# Patient Record
Sex: Female | Born: 1990 | Race: White | Hispanic: No | Marital: Single | Smoking: Former smoker
Health system: Southern US, Community
[De-identification: ages and names within clinical notes are randomized; demographics above are authoritative.]

## PROBLEM LIST (undated history)

## (undated) ENCOUNTER — Inpatient Hospital Stay (HOSPITAL_COMMUNITY): Payer: Self-pay

## (undated) DIAGNOSIS — Z789 Other specified health status: Secondary | ICD-10-CM

## (undated) DIAGNOSIS — N390 Urinary tract infection, site not specified: Secondary | ICD-10-CM

## (undated) DIAGNOSIS — Z3490 Encounter for supervision of normal pregnancy, unspecified, unspecified trimester: Secondary | ICD-10-CM

## (undated) HISTORY — PX: NO PAST SURGERIES: SHX2092

## (undated) HISTORY — DX: Encounter for supervision of normal pregnancy, unspecified, unspecified trimester: Z34.90

---
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
## 2010-02-25 ENCOUNTER — Emergency Department (HOSPITAL_COMMUNITY): Admission: EM | Admit: 2010-02-25 | Discharge: 2010-02-25 | Payer: Self-pay | Admitting: Emergency Medicine

## 2010-09-26 ENCOUNTER — Emergency Department (HOSPITAL_COMMUNITY)
Admission: EM | Admit: 2010-09-26 | Discharge: 2010-09-26 | Payer: Self-pay | Source: Home / Self Care | Admitting: Emergency Medicine

## 2011-12-17 ENCOUNTER — Emergency Department (HOSPITAL_COMMUNITY)
Admission: EM | Admit: 2011-12-17 | Discharge: 2011-12-17 | Disposition: A | Discharge status: Home / Self Care | Payer: Self-pay | Attending: Emergency Medicine | Admitting: Emergency Medicine

## 2011-12-17 ENCOUNTER — Encounter (HOSPITAL_COMMUNITY): Payer: Self-pay | Admitting: *Deleted

## 2011-12-17 DIAGNOSIS — M545 Low back pain, unspecified: Secondary | ICD-10-CM | POA: Insufficient documentation

## 2011-12-17 DIAGNOSIS — Z7982 Long term (current) use of aspirin: Secondary | ICD-10-CM | POA: Insufficient documentation

## 2011-12-17 DIAGNOSIS — Z880 Allergy status to penicillin: Secondary | ICD-10-CM | POA: Insufficient documentation

## 2011-12-17 DIAGNOSIS — F17200 Nicotine dependence, unspecified, uncomplicated: Secondary | ICD-10-CM | POA: Insufficient documentation

## 2011-12-17 LAB — URINALYSIS, ROUTINE W REFLEX MICROSCOPIC
Bilirubin Urine: NEGATIVE
Glucose, UA: NEGATIVE mg/dL
Nitrite: NEGATIVE
Specific Gravity, Urine: 1.006 (ref 1.005–1.030)
pH: 6 (ref 5.0–8.0)

## 2011-12-17 MED ORDER — IBUPROFEN 600 MG PO TABS: 600.0000 mg | ORAL_TABLET | Freq: Four times a day (QID) | ORAL | Status: AC | PRN

## 2011-12-17 MED ORDER — CYCLOBENZAPRINE HCL 10 MG PO TABS: 10.0000 mg | ORAL_TABLET | Freq: Three times a day (TID) | ORAL | Status: AC | PRN

## 2011-12-17 NOTE — ED Provider Notes (Signed)
Medical screening examination/treatment/procedure(s) were performed by non-physician practitioner and as supervising physician I was immediately available for consultation/collaboration.   Andrew King, MD 12/17/11 2056 

## 2011-12-17 NOTE — ED Notes (Deleted)
Called patient for treatment room twice and no response. Nurse informed that patient went to the cafeteria.

## 2011-12-17 NOTE — ED Notes (Signed)
Called patient for room twice. No response.

## 2011-12-17 NOTE — Discharge Instructions (Signed)
Back Exercises   Back exercises help treat and prevent back injuries. The goal of back exercises is to increase the strength of your abdominal and back muscles and the flexibility of your back. These exercises should be started when you no longer have back pain. Back exercises include:   Pelvic Tilt. Lie on your back with your knees bent. Tilt your pelvis until the lower part of your back is against the floor. Hold this position 5 to 10 sec and repeat 5 to 10 times.   Knee to Chest. Pull first 1 knee up against your chest and hold for 20 to 30 seconds, repeat this with the other knee, and then both knees. This may be done with the other leg straight or bent, whichever feels better.   Sit-Ups or Curl-Ups. Bend your knees 90 degrees. Start with tilting your pelvis, and do a partial, slow sit-up, lifting your trunk only 30 to 45 degrees off the floor. Take at least 2 to 3 seconds for each sit-up. Do not do sit-ups with your knees out straight. If partial sit-ups are difficult, simply do the above but with only tightening your abdominal muscles and holding it as directed.   Hip-Lift. Lie on your back with your knees flexed 90 degrees. Push down with your feet and shoulders as you raise your hips a couple inches off the floor; hold for 10 seconds, repeat 5 to 10 times.   Back arches. Lie on your stomach, propping yourself up on bent elbows. Slowly press on your hands, causing an arch in your low back. Repeat 3 to 5 times. Any initial stiffness and discomfort should lessen with repetition over time.   Shoulder-Lifts. Lie face down with arms beside your body. Keep hips and torso pressed to floor as you slowly lift your head and shoulders off the floor.   Do not overdo your exercises, especially in the beginning. Exercises may cause you some mild back discomfort which lasts for a few minutes; however, if the pain is more severe, or lasts for more than 15 minutes, do not continue exercises until you see your caregiver.  Improvement with exercise therapy for back problems is slow.   See your caregivers for assistance with developing a proper back exercise program.   Document Released: 10/29/2004 Document Revised: 09/10/2011 Document Reviewed: 09/21/2005   [employer]® Patient Information ©2012 [employer].     Back Pain, Adult   Low back pain is very common. About 1 in 5 people have back pain. The cause of low back pain is rarely dangerous. The pain often gets better over time. About half of people with a sudden onset of back pain feel better in just 2 weeks. About 8 in 10 people feel better by 6 weeks.   CAUSES   Some common causes of back pain include:   Strain of the muscles or ligaments supporting the spine.   Wear and tear (degeneration) of the spinal discs.   Arthritis.   Direct injury to the back.   DIAGNOSIS   Most of the time, the direct cause of low back pain is not known. However, back pain can be treated effectively even when the exact cause of the pain is unknown. Answering your caregiver's questions about your overall health and symptoms is one of the most accurate ways to make sure the cause of your pain is not dangerous. If your caregiver needs more information, he or she may order lab work or imaging tests (X-rays or MRIs). However, even   if imaging tests show changes in your back, this usually does not require surgery.   HOME CARE INSTRUCTIONS   For many people, back pain returns. Since low back pain is rarely dangerous, it is often a condition that people can learn to manage on their own.   Remain active. It is stressful on the back to sit or stand in one place. Do not sit, drive, or stand in one place for more than 30 minutes at a time. Take short walks on level surfaces as soon as pain allows. Try to increase the length of time you walk each day.   Do not stay in bed. Resting more than 1 or 2 days can delay your recovery.   Do not avoid exercise or work. Your body is made to move. It is not dangerous to be active,  even though your back may hurt. Your back will likely heal faster if you return to being active before your pain is gone.   Pay attention to your body when you bend and lift. Many people have less discomfort when lifting if they bend their knees, keep the load close to their bodies, and avoid twisting. Often, the most comfortable positions are those that put less stress on your recovering back.   Find a comfortable position to sleep. Use a firm mattress and lie on your side with your knees slightly bent. If you lie on your back, put a pillow under your knees.   Only take over-the-counter or prescription medicines as directed by your caregiver. Over-the-counter medicines to reduce pain and inflammation are often the most helpful. Your caregiver may prescribe muscle relaxant drugs. These medicines help dull your pain so you can more quickly return to your normal activities and healthy exercise.   Put ice on the injured area.   Put ice in a plastic bag.   Place a towel between your skin and the bag.   Leave the ice on for 15 to 20 minutes, 3 to 4 times a day for the first 2 to 3 days. After that, ice and heat may be alternated to reduce pain and spasms.   Ask your caregiver about trying back exercises and gentle massage. This may be of some benefit.   Avoid feeling anxious or stressed. Stress increases muscle tension and can worsen back pain. It is important to recognize when you are anxious or stressed and learn ways to manage it. Exercise is a great option.   SEEK MEDICAL CARE IF:   You have pain that is not relieved with rest or medicine.   You have pain that does not improve in 1 week.   You have new symptoms.   You are generally not feeling well.   SEEK IMMEDIATE MEDICAL CARE IF:   You have pain that radiates from your back into your legs.   You develop new bowel or bladder control problems.   You have unusual weakness or numbness in your arms or legs.   You develop nausea or vomiting.   You develop abdominal  pain.   You feel faint.   Document Released: 09/21/2005 Document Revised: 09/10/2011 Document Reviewed: 02/09/2011   [employer]® Patient Information ©2012 [employer].

## 2011-12-17 NOTE — ED Notes (Signed)
Called to reassess.  Pt not in waiting room.  Other pts report pt went up in elevator.

## 2011-12-17 NOTE — ED Notes (Signed)
Pt reports 5 day hx of lower back pain radiating from mid back to bilateral flank. Reports increased urination. Denies fevers.

## 2011-12-17 NOTE — ED Notes (Signed)
Pt returned from upstairs.  No distress noted, pt ambulating without difficulty.

## 2011-12-17 NOTE — ED Provider Notes (Signed)
History     CSN: [id number]  Arrival date & time 12/17/11  1616   First MD Initiated Contact with Patient 12/17/11 2003      Chief Complaint  Patient presents with  . Back Pain    (Consider location/radiation/quality/duration/timing/severity/associated sxs/prior treatment) HPI Comments: 5 days ago to walk on the treadmill since then.  She has had lower back pain.  She has taken Advil without relief.  She has constant pain in her lower back  Patient is a 21 y.o. female presenting with back pain. The history is provided by the patient.  Back Pain  This is a new problem. The current episode started more than 2 days ago. The problem occurs constantly. The problem has not changed since onset.The pain is associated with twisting. The quality of the pain is described as aching. The pain does not radiate. The pain is at a severity of 5/10. The symptoms are aggravated by certain positions. Pertinent negatives include no fever. She has tried NSAIDs for the symptoms. Risk factors include obesity and lack of exercise.    History reviewed. No pertinent past medical history.  History reviewed. No pertinent past surgical history.  History reviewed. No pertinent family history.  History  Substance Use Topics  . Smoking status: Current Everyday Smoker  . Smokeless tobacco: Not on file  . Alcohol Use: Yes    OB History    Grav Para Term Preterm Abortions TAB SAB Ect Mult Living                  Review of Systems  Constitutional: Negative for fever and chills.  Musculoskeletal: Positive for back pain.  Neurological: Negative for dizziness.    Allergies  Penicillins  Home Medications   Current Outpatient Rx  Name Route Sig Dispense Refill  . ASPIRIN 325 MG PO TABS Oral Take 325 mg by mouth daily as needed. For pain    . BISMUTH SUBSALICYLATE 262 MG/15ML PO SUSP Oral Take 15 mLs by mouth every 6 (six) hours as needed. For stomach pain    . NAPROXEN SODIUM 220 MG PO TABS Oral Take  660 mg by mouth daily as needed. For pain    . CYCLOBENZAPRINE HCL 10 MG PO TABS Oral Take 1 tablet (10 mg total) by mouth 3 (three) times daily as needed for muscle spasms. 30 tablet 0  . IBUPROFEN 600 MG PO TABS Oral Take 1 tablet (600 mg total) by mouth every 6 (six) hours as needed for pain. 30 tablet 0    BP 128/78  Pulse 79  Temp(Src) 97.8 F (36.6 C) (Oral)  Resp 18  SpO2 100%  LMP 12/10/2011  Physical Exam  Constitutional: She is oriented to person, place, and time. She appears well-developed.  HENT:  Head: Normocephalic.  Neck: Normal range of motion.  Cardiovascular: Normal rate.   Pulmonary/Chest: Effort normal.  Musculoskeletal: Normal range of motion.  Neurological: She is alert and oriented to person, place, and time.  Skin: Skin is warm.    ED Course  Procedures (including critical care time)   Labs Reviewed  URINALYSIS, ROUTINE W REFLEX MICROSCOPIC  POCT PREGNANCY, URINE   No results found.   1. Low back pain       MDM  Low back pain due to exercise regime.  Will prescribe Flexeril and regular.  Uses of nonsteroidal        Arman Filter, NP 12/17/11 2017  Arman Filter, NP 12/17/11 2019

## 2012-08-08 ENCOUNTER — Emergency Department (INDEPENDENT_AMBULATORY_CARE_PROVIDER_SITE_OTHER)
Admission: EM | Admit: 2012-08-08 | Discharge: 2012-08-08 | Disposition: A | Discharge status: Home / Self Care | Payer: BC Managed Care – PPO | Source: Home / Self Care | Attending: Emergency Medicine | Admitting: Emergency Medicine

## 2012-08-08 ENCOUNTER — Encounter (HOSPITAL_COMMUNITY): Payer: Self-pay | Admitting: Emergency Medicine

## 2012-08-08 DIAGNOSIS — R52 Pain, unspecified: Secondary | ICD-10-CM

## 2012-08-08 DIAGNOSIS — L03319 Cellulitis of trunk, unspecified: Secondary | ICD-10-CM

## 2012-08-08 DIAGNOSIS — L02212 Cutaneous abscess of back [any part, except buttock]: Secondary | ICD-10-CM

## 2012-08-08 MED ORDER — HYDROCODONE-ACETAMINOPHEN 5-325 MG PO TABS
ORAL_TABLET | ORAL | Status: AC
  Filled 2012-08-08: qty 1

## 2012-08-08 MED ORDER — SULFAMETHOXAZOLE-TRIMETHOPRIM 800-160 MG PO TABS: 1.0000 | ORAL_TABLET | Freq: Two times a day (BID) | ORAL | Status: AC

## 2012-08-08 MED ORDER — HYDROCODONE-ACETAMINOPHEN 5-325 MG PO TABS: 1.0000 | ORAL_TABLET | Freq: Once | ORAL | Status: DC

## 2012-08-08 MED ORDER — HYDROCODONE-ACETAMINOPHEN 5-325 MG PO TABS
1.0000 | ORAL_TABLET | Freq: Once | ORAL | Status: AC
  Administered 2012-08-08: 1 via ORAL

## 2012-08-08 NOTE — ED Notes (Signed)
Large, red area in mid upper back, warm to touch.  Area is tender to touch.  Patient in a gown.

## 2012-08-08 NOTE — ED Provider Notes (Signed)
History     CSN: [id number]  Arrival date & time 08/08/12  1816   First MD Initiated Contact with Patient 08/08/12 1953      Chief Complaint  Patient presents with  . Abscess    (Consider location/radiation/quality/duration/timing/severity/associated sxs/prior treatment) Patient is a 21 y.o. female presenting with abscess. The history is provided by the patient and the spouse.  Abscess  This is a new problem. The current episode started less than one week ago. The onset was gradual. The problem has been gradually worsening. The abscess is present on the back. The problem is moderate. The abscess is characterized by redness, painfulness and swelling. It is unknown what she was exposed to. The abscess first occurred at home. Pertinent negatives include no anorexia, no decrease in physical activity, not sleeping less, no fever, no diarrhea and no vomiting. Her past medical history does not include atopy in family or skin abscesses in family. There were no sick contacts. Recent Medical Care: husband tried to drain this week with no success.  no history of abscess  History reviewed. No pertinent past medical history.  History reviewed. No pertinent past surgical history.  No family history on file.  History  Substance Use Topics  . Smoking status: Current Every Day Smoker  . Smokeless tobacco: Not on file  . Alcohol Use: Yes    OB History    Grav Para Term Preterm Abortions TAB SAB Ect Mult Living                  Review of Systems  Constitutional: Negative for fever.  Gastrointestinal: Negative for vomiting, diarrhea and anorexia.  Skin: Positive for wound.  All other systems reviewed and are negative.    Allergies  Penicillins  Home Medications   Current Outpatient Rx  Name  Route  Sig  Dispense  Refill  . MIDOL PO   Oral   Take by mouth.         . ASPIRIN 325 MG PO TABS   Oral   Take 325 mg by mouth daily as needed. For pain         . BISMUTH  SUBSALICYLATE 262 MG/15ML PO SUSP   Oral   Take 15 mLs by mouth every 6 (six) hours as needed. For stomach pain         . HYDROCODONE-ACETAMINOPHEN 5-325 MG PO TABS   Oral   Take 1 tablet by mouth once.   30 tablet   0   . NAPROXEN SODIUM 220 MG PO TABS   Oral   Take 660 mg by mouth daily as needed. For pain         . SULFAMETHOXAZOLE-TRIMETHOPRIM 800-160 MG PO TABS   Oral   Take 1 tablet by mouth 2 (two) times daily.   14 tablet   0     BP 129/87  Pulse 83  Temp 98.3 F (36.8 C) (Oral)  Resp 18  SpO2 99%  LMP 08/08/2012  Physical Exam  Nursing note and vitals reviewed. Constitutional: She is oriented to person, place, and time. Vital signs are normal. She appears well-developed and well-nourished. She is active and cooperative.  HENT:  Head: Normocephalic.  Eyes: Conjunctivae normal are normal. Pupils are equal, round, and reactive to light. No scleral icterus.  Neck: Trachea normal. Neck supple.  Cardiovascular: Normal rate, regular rhythm, normal heart sounds and intact distal pulses.   Pulmonary/Chest: Effort normal and breath sounds normal.  Neurological: She is alert and oriented  to person, place, and time. No cranial nerve deficit or sensory deficit.  Skin: Skin is warm and dry.     Psychiatric: She has a normal mood and affect. Her speech is normal and behavior is normal. Judgment and thought content normal. Cognition and memory are normal.    ED Course  INCISION AND DRAINAGE Date/Time: 08/08/2012 8:35 PM Performed by: Nigel Mormon Angelene Rome L Authorized by: Jimmie Molly Consent: Verbal consent obtained. Risks and benefits: risks, benefits and alternatives were discussed Consent given by: patient and parent Patient understanding: patient states understanding of the procedure being performed Patient consent: the patient's understanding of the procedure matches consent given Procedure consent: procedure consent matches procedure scheduled Patient identity  confirmed: verbally with patient and arm band Time out: Immediately prior to procedure a "time out" was called to verify the correct patient, procedure, equipment, support staff and site/side marked as required. Type: abscess Body area: trunk Location details: back Anesthesia: local infiltration Local anesthetic: lidocaine 2% with epinephrine Anesthetic total: 2 ml Scalpel size: 11 Needle gauge: 27. Incision type: cross cut. Complexity: simple Drainage: purulent, serosanguinous and serous Drainage amount: moderate Wound treatment: wound left open Packing material: 1/4 in iodoform gauze Patient tolerance: Patient tolerated the procedure well with no immediate complications.   (including critical care time)   Labs Reviewed  CULTURE, ROUTINE-ABSCESS   No results found.   1. Abscess of upper back excluding scapular region   2. Pain       MDM  Remove iodoform in 36-48 hour, rtc in 3 days for wound recheck.  Begin antibiotics as prescribed, await culture results.          Johnsie Kindred, NP 08/08/12 2057

## 2012-08-08 NOTE — ED Notes (Signed)
Patient reports abscess in between shoulders.  Reports a fullness for 2 years and had been told to just watch "cyst" .  Told if it bothers her or were to get infected, call a dermatologist.  Friend tried to open cyst , now infected.  Cyst was cut open a month ago.  Reports she hit this area on something at work, now very painful

## 2012-08-09 NOTE — ED Provider Notes (Signed)
Medical screening examination/treatment/procedure(s) were performed by non-physician practitioner and as supervising physician I was immediately available for consultation/collaboration.  Raynald Blend, MD 08/09/12 1054

## 2012-08-12 LAB — CULTURE, ROUTINE-ABSCESS

## 2012-08-18 NOTE — ED Notes (Signed)
Abscess culture upper middle back: mod. diptheroids ( corynebacterium species).  Lab shown to Dr. Lorenz Coaster and he said it is skin contaminant.  No further action needed. Chelsea Dorsey 08/18/2012

## 2013-04-10 ENCOUNTER — Encounter (HOSPITAL_COMMUNITY): Payer: Self-pay | Admitting: *Deleted

## 2013-04-10 ENCOUNTER — Emergency Department (HOSPITAL_COMMUNITY)
Admission: EM | Admit: 2013-04-10 | Discharge: 2013-04-10 | Disposition: A | Discharge status: Home / Self Care | Payer: Self-pay | Attending: Emergency Medicine | Admitting: Emergency Medicine

## 2013-04-10 DIAGNOSIS — Z79899 Other long term (current) drug therapy: Secondary | ICD-10-CM | POA: Insufficient documentation

## 2013-04-10 DIAGNOSIS — R109 Unspecified abdominal pain: Secondary | ICD-10-CM | POA: Insufficient documentation

## 2013-04-10 DIAGNOSIS — Z880 Allergy status to penicillin: Secondary | ICD-10-CM | POA: Insufficient documentation

## 2013-04-10 DIAGNOSIS — R112 Nausea with vomiting, unspecified: Secondary | ICD-10-CM | POA: Insufficient documentation

## 2013-04-10 DIAGNOSIS — Z3202 Encounter for pregnancy test, result negative: Secondary | ICD-10-CM | POA: Insufficient documentation

## 2013-04-10 DIAGNOSIS — F17200 Nicotine dependence, unspecified, uncomplicated: Secondary | ICD-10-CM | POA: Insufficient documentation

## 2013-04-10 LAB — URINALYSIS, ROUTINE W REFLEX MICROSCOPIC
Bilirubin Urine: NEGATIVE
Glucose, UA: NEGATIVE mg/dL
Ketones, ur: NEGATIVE mg/dL
Nitrite: NEGATIVE
pH: 6 (ref 5.0–8.0)

## 2013-04-10 MED ORDER — ONDANSETRON 4 MG PO TBDP
8.0000 mg | ORAL_TABLET | Freq: Once | ORAL | Status: AC
  Administered 2013-04-10: 8 mg via ORAL
  Filled 2013-04-10: qty 2

## 2013-04-10 MED ORDER — ONDANSETRON HCL 4 MG PO TABS: 4.0000 mg | ORAL_TABLET | Freq: Four times a day (QID) | ORAL | Status: DC

## 2013-04-10 NOTE — ED Provider Notes (Signed)
History    CSN: [id number] Arrival date & time 04/10/13  7829  First MD Initiated Contact with Patient 04/10/13 1011     Chief Complaint  Patient presents with  . Emesis   (Consider location/radiation/quality/duration/timing/severity/associated sxs/prior Treatment) Patient is a 22 y.o. female presenting with vomiting. The history is provided by the patient. No language interpreter was used.  Emesis Severity:  Mild Associated symptoms: abdominal pain   Associated symptoms: no chills and no diarrhea   Associated symptoms comment:  Morning nausea and vomiting x 1 yesterday and x 2 today. Mild right sided abdominal discomfort that is mild. She reports she is able to drink Sprite regularly without vomiting. No fever, diarrhea or hematemesis. She reports trying to become pregnant but getting negative tests at home.   History reviewed. No pertinent past medical history. History reviewed. No pertinent past surgical history. No family history on file. History  Substance Use Topics  . Smoking status: Current Every Day Smoker  . Smokeless tobacco: Not on file  . Alcohol Use: Yes   OB History   Grav Para Term Preterm Abortions TAB SAB Ect Mult Living                 Review of Systems  Constitutional: Negative for fever and chills.  Respiratory: Negative.  Negative for cough and shortness of breath.   Cardiovascular: Negative.  Negative for chest pain.  Gastrointestinal: Positive for nausea, vomiting and abdominal pain. Negative for diarrhea.    Allergies  Penicillins  Home Medications   Current Outpatient Rx  Name  Route  Sig  Dispense  Refill  . aspirin-acetaminophen-caffeine (EXCEDRIN MIGRAINE) 250-250-65 MG per tablet   Oral   Take 2 tablets by mouth every 6 (six) hours as needed for pain.         Marland Kitchen bismuth subsalicylate (PEPTO BISMOL) 262 MG/15ML suspension   Oral   Take 15 mLs by mouth every 6 (six) hours as needed. For stomach pain          BP 138/86  Pulse 81   Temp(Src) 97.7 F (36.5 C) (Oral)  Resp 20  SpO2 99% Physical Exam  Constitutional: She is oriented to person, place, and time. She appears well-developed and well-nourished.  HENT:  Head: Normocephalic.  Mouth/Throat: Oropharynx is clear and moist.  Neck: Normal range of motion. Neck supple.  Cardiovascular: Normal rate and regular rhythm.   Pulmonary/Chest: Effort normal and breath sounds normal.  Abdominal: Soft. Bowel sounds are normal. There is tenderness. There is no rebound and no guarding.  Minimal right periumbilical tenderness. No guarding or rebound.   Musculoskeletal: Normal range of motion.  Neurological: She is alert and oriented to person, place, and time.  Skin: Skin is warm and dry. No rash noted.  Psychiatric: She has a normal mood and affect.    ED Course  Procedures (including critical care time) Labs Reviewed  URINALYSIS, ROUTINE W REFLEX MICROSCOPIC  POCT PREGNANCY, URINE   Results for orders placed during the hospital encounter of 04/10/13  URINALYSIS, ROUTINE W REFLEX MICROSCOPIC      Result Value Range   Color, Urine YELLOW  YELLOW   APPearance CLOUDY (*) CLEAR   Specific Gravity, Urine 1.019  1.005 - 1.030   pH 6.0  5.0 - 8.0   Glucose, UA NEGATIVE  NEGATIVE mg/dL   Hgb urine dipstick NEGATIVE  NEGATIVE   Bilirubin Urine NEGATIVE  NEGATIVE   Ketones, ur NEGATIVE  NEGATIVE mg/dL   Protein, ur  NEGATIVE  NEGATIVE mg/dL   Urobilinogen, UA 0.2  0.0 - 1.0 mg/dL   Nitrite NEGATIVE  NEGATIVE   Leukocytes, UA NEGATIVE  NEGATIVE  POCT PREGNANCY, URINE      Result Value Range   Preg Test, Ur NEGATIVE  NEGATIVE     No results found. No diagnosis found. 1. Nausea with vomiting MDM  Pregnancy tests are negative in ED. She is tolerating PO fluids and appears stable. Zofran Rx.   Arnoldo Hooker, PA-C 04/19/13 [phone]

## 2013-04-10 NOTE — ED Notes (Signed)
Pt is here with lower abdominal discomfort, states that she started having nausea and burps that smell like rotten eggs, today vomited once.  LMP:  Middle of June.  No vag discharge

## 2013-04-19 NOTE — ED Provider Notes (Signed)
Medical screening examination/treatment/procedure(s) were performed by non-physician practitioner and as supervising physician I was immediately available for consultation/collaboration.  Kanijah Groseclose T Janine Reller, MD 04/19/13 1531 

## 2013-08-28 ENCOUNTER — Encounter (HOSPITAL_COMMUNITY): Payer: Self-pay | Admitting: *Deleted

## 2013-08-28 ENCOUNTER — Inpatient Hospital Stay (HOSPITAL_COMMUNITY)
Admission: AD | Admit: 2013-08-28 | Discharge: 2013-08-28 | Disposition: A | Discharge status: Home / Self Care | Payer: Self-pay | Source: Ambulatory Visit | Attending: Obstetrics and Gynecology | Admitting: Obstetrics and Gynecology

## 2013-08-28 DIAGNOSIS — N926 Irregular menstruation, unspecified: Secondary | ICD-10-CM | POA: Insufficient documentation

## 2013-08-28 DIAGNOSIS — B9689 Other specified bacterial agents as the cause of diseases classified elsewhere: Secondary | ICD-10-CM | POA: Insufficient documentation

## 2013-08-28 DIAGNOSIS — A499 Bacterial infection, unspecified: Secondary | ICD-10-CM | POA: Insufficient documentation

## 2013-08-28 DIAGNOSIS — N760 Acute vaginitis: Secondary | ICD-10-CM | POA: Insufficient documentation

## 2013-08-28 DIAGNOSIS — M549 Dorsalgia, unspecified: Secondary | ICD-10-CM | POA: Insufficient documentation

## 2013-08-28 DIAGNOSIS — F17200 Nicotine dependence, unspecified, uncomplicated: Secondary | ICD-10-CM | POA: Insufficient documentation

## 2013-08-28 HISTORY — DX: Urinary tract infection, site not specified: N39.0

## 2013-08-28 LAB — URINALYSIS, ROUTINE W REFLEX MICROSCOPIC
Glucose, UA: NEGATIVE mg/dL
Hgb urine dipstick: NEGATIVE
Specific Gravity, Urine: 1.025 (ref 1.005–1.030)

## 2013-08-28 LAB — HCG, SERUM, QUALITATIVE: Preg, Serum: NEGATIVE

## 2013-08-28 LAB — POCT PREGNANCY, URINE: Preg Test, Ur: NEGATIVE

## 2013-08-28 LAB — WET PREP, GENITAL

## 2013-08-28 MED ORDER — PRENATAL PLUS 27-1 MG PO TABS: 1.0000 | ORAL_TABLET | Freq: Every day | ORAL | Status: DC

## 2013-08-28 MED ORDER — METRONIDAZOLE 500 MG PO TABS: 500.0000 mg | ORAL_TABLET | Freq: Two times a day (BID) | ORAL | Status: AC

## 2013-08-28 NOTE — MAU Provider Note (Signed)
CC: Possible Pregnancy and Back Pain     HPI Chelsea Dorsey is a 22 y.o.  Nulligravida who presents with missed menses and thinks she's pregnant despite neg HPT. Requests blood test. LMP 07/07/13 and spotted 08/09/13. Prior to October, she had had regular monthly cycles. Has premenstrual-like cramping that radiates to low back. No irritative vaginal discharge.  No contraception and attempting pregnancy.  Past Medical History  Diagnosis Date  . Asthma   . UTI (lower urinary tract infection)     OB History  Gravida Para Term Preterm AB SAB TAB Ectopic Multiple Living  0                 No past surgical history on file.  History   Social History  . Marital Status: Married    Spouse Name: N/A    Number of Children: N/A  . Years of Education: N/A   Occupational History  . Not on file.   Social History Main Topics  . Smoking status: Current Every Day Smoker  . Smokeless tobacco: Not on file  . Alcohol Use: Yes  . Drug Use: No  . Sexual Activity:    Other Topics Concern  . Not on file   Social History Narrative  . No narrative on file    No current facility-administered medications on file prior to encounter.   Current Outpatient Prescriptions on File Prior to Encounter  Medication Sig Dispense Refill  . aspirin-acetaminophen-caffeine (EXCEDRIN MIGRAINE) 250-250-65 MG per tablet Take 2 tablets by mouth every 6 (six) hours as needed for pain.        Allergies  Allergen Reactions  . Penicillins Hives    ROS Pertinent items in HPI  PHYSICAL EXAM Filed Vitals:   08/28/13 1959  BP: 125/93  Pulse: 71  Temp: 98.5 F (36.9 C)  Resp: 20   General: Obese female in no acute distress Cardiovascular: Normal rate Respiratory: Normal effort Abdomen: Soft, nontender Back: No CVAT Extremities: No edema Neurologic: Alert and oriented Speculum exam: NEFG; vagina with gray mucusy discharge, scant blood; cervix clean Bimanual exam: cervix closed, no CMT; uterus  NSSP; no adnexal tenderness or masses   LAB RESULTS Results for orders placed during the hospital encounter of 08/28/13 (from the past 24 hour(s))  URINALYSIS, ROUTINE W REFLEX MICROSCOPIC     Status: None   Collection Time    08/28/13  8:00 PM      Result Value Range   Color, Urine YELLOW  YELLOW   APPearance CLEAR  CLEAR   Specific Gravity, Urine 1.025  1.005 - 1.030   pH 6.5  5.0 - 8.0   Glucose, UA NEGATIVE  NEGATIVE mg/dL   Hgb urine dipstick NEGATIVE  NEGATIVE   Bilirubin Urine NEGATIVE  NEGATIVE   Ketones, ur NEGATIVE  NEGATIVE mg/dL   Protein, ur NEGATIVE  NEGATIVE mg/dL   Urobilinogen, UA 0.2  0.0 - 1.0 mg/dL   Nitrite NEGATIVE  NEGATIVE   Leukocytes, UA NEGATIVE  NEGATIVE  POCT PREGNANCY, URINE     Status: None   Collection Time    08/28/13  8:07 PM      Result Value Range   Preg Test, Ur NEGATIVE  NEGATIVE  WET PREP, GENITAL     Status: Abnormal   Collection Time    08/28/13  8:52 PM      Result Value Range   Yeast Wet Prep HPF POC NONE SEEN  NONE SEEN   Trich, Wet Prep NONE SEEN  NONE SEEN   Clue Cells Wet Prep HPF POC MODERATE (*) NONE SEEN   WBC, Wet Prep HPF POC RARE (*) NONE SEEN    IMAGING No results found.  MAU COURSE GC/CT sent  ASSESSMENT  1. Irregular menstrual cycle   2. BV (bacterial vaginosis)   3. Smoker   Probably beginning menstual period  PLAN Discharge home. See AVS for patient education. Keep bleeding calendar and stop smoking   Medication List    STOP taking these medications       aspirin-acetaminophen-caffeine 250-250-65 MG per tablet  Commonly known as:  EXCEDRIN MIGRAINE      TAKE these medications       calcium carbonate 500 MG chewable tablet  Commonly known as:  TUMS - dosed in mg elemental calcium  Chew 1-2 tablets by mouth daily as needed for indigestion or heartburn.     prenatal vitamin w/FE, FA 27-1 MG Tabs tablet  Take 1 tablet by mouth daily.       Follow-up Information   Follow up with [facility].   Specialty:  Obstetrics and Gynecology   Contact information:   [address] [place] [address] [phone]      Schedule an appointment as soon as possible for a visit with [facility]. (As needed)    Specialty:  Obstetrics and Gynecology   Contact information:   [address] [place] [address] [phone]       Danae Orleans, CNM 08/28/2013 8:29 PM

## 2013-08-28 NOTE — MAU Note (Signed)
LMP 07/07/2013, beginning of November started having lower back pain, nausea.

## 2013-08-29 LAB — GC/CHLAMYDIA PROBE AMP: GC Probe RNA: NEGATIVE

## 2013-08-30 NOTE — MAU Provider Note (Signed)
Attestation of Attending Supervision of Advanced Practitioner (CNM/NP): Evaluation and management procedures were performed by the Advanced Practitioner under my supervision and collaboration.  I have reviewed the Advanced Practitioner's note and chart, and I agree with the management and plan.  Elanna Bert 08/30/2013 9:39 AM   

## 2014-04-12 ENCOUNTER — Emergency Department (HOSPITAL_COMMUNITY)
Admission: EM | Admit: 2014-04-12 | Discharge: 2014-04-12 | Disposition: A | Discharge status: Home / Self Care | Payer: BC Managed Care – PPO | Source: Home / Self Care | Attending: Emergency Medicine | Admitting: Emergency Medicine

## 2014-04-12 ENCOUNTER — Encounter (HOSPITAL_COMMUNITY): Payer: Self-pay | Admitting: Emergency Medicine

## 2014-04-12 DIAGNOSIS — J039 Acute tonsillitis, unspecified: Secondary | ICD-10-CM

## 2014-04-12 LAB — POCT RAPID STREP A: Streptococcus, Group A Screen (Direct): NEGATIVE

## 2014-04-12 MED ORDER — PREDNISONE 20 MG PO TABS
20.0000 mg | ORAL_TABLET | Freq: Two times a day (BID) | ORAL | Status: DC
Start: 2014-04-12 — End: 2014-04-23

## 2014-04-12 MED ORDER — AZITHROMYCIN 500 MG PO TABS: 500.0000 mg | ORAL_TABLET | Freq: Every day | ORAL | Status: DC

## 2014-04-12 NOTE — ED Provider Notes (Signed)
  Chief Complaint   Chief Complaint  Patient presents with  . Sore Throat    History of Present Illness   Chelsea Dorsey is a 23 year old female who's had a three-day history of severe sore throat, pain on swallowing, white spots on her tonsils, subjective fever, chills, sweats, nasal congestion, rhinorrhea, headache, neck pain, left ear pain, and cough. She denies any exposure to strep or any other illnesses.   Review of Systems   Other than as noted above, the patient denies any of the following symptoms. Systemic:  No fever, chills, sweats, myalgias, or headache. Eye:  No redness, pain or drainage. ENT:  No earache, nasal congestion, sneezing, rhinorrhea, sinus pressure, sinus pain, or post nasal drip. Lungs:  No cough, sputum production, wheezing, shortness of breath, or chest pain. GI:  No abdominal pain, nausea, vomiting, or diarrhea. Skin:  No rash.  White   Past medical history, family history, social history, meds, and allergies were reviewed.   Physical Exam     Vital signs:  BP 121/78  Pulse 83  Temp(Src) 98.2 F (36.8 C) (Oral)  Resp 16  SpO2 100%  LMP 04/01/2014 General:  Alert, in no distress. Phonation was normal, no drooling, and patient was able to handle secretions well.  Eye:  No conjunctival injection or drainage. Lids were normal. ENT:  TMs and canals were normal, without erythema or inflammation.  Nasal mucosa was clear and uncongested, without drainage.  Mucous membranes were moist.  Exam of pharynx tonsils are mildly enlarged and covered with white exudate.  There were no oral ulcerations or lesions. There was no bulging of the tonsillar pillars, and the uvula was midline. Neck:  Supple, anterior cervical nodes are tender but not enlarged. Lungs:  No respiratory distress.  Lungs were clear to auscultation, without wheezes, rales or rhonchi.  Breath sounds were clear and equal bilaterally.  Heart:  Regular rhythm, without gallops, murmers or rubs. Skin:   Clear, warm, and dry, without rash or lesions.  Labs   Results for orders placed during the hospital encounter of 04/12/14  POCT RAPID STREP A ([facility]RG CARE ONLY)      Result Value Ref Range   Streptococcus, Group A Screen (Direct) NEGATIVE  NEGATIVE   Assessment   The encounter diagnosis was Tonsillitis.  There is no evidence of a peritonsillar abscess, retropharyngeal abscess, or epiglottitis.   Plan     1.  Meds:  The following meds were prescribed:   Discharge Medication List as of 04/12/2014 10:36 AM    START taking these medications   Details  azithromycin (ZITHROMAX) 500 MG tablet Take 1 tablet (500 mg total) by mouth daily., Starting 04/12/2014, Until Discontinued, Normal    predniSONE (DELTASONE) 20 MG tablet Take 1 tablet (20 mg total) by mouth 2 (two) times daily., Starting 04/12/2014, Until Discontinued, Normal        2.  Patient Education/Counseling:  The patient was given appropriate handouts, self care instructions, and instructed in symptomatic relief, including hot saline gargles, throat lozenges, infectious precautions, and need to trade out toothbrush.    3.  Follow up:  The patient was told to follow up here if no better in 3 to 4 days, or sooner if becoming worse in any way, and given some red flag symptoms such as difficulty swallowing or breathing which would prompt immediate return.      Harden Mo, MD 04/12/14 [phone]

## 2014-04-12 NOTE — Discharge Instructions (Signed)
Tonsillitis Tonsillitis is an infection of the throat that causes the tonsils to become red, tender, and swollen. Tonsils are collections of lymphoid tissue at the back of the throat. Each tonsil has crevices (crypts). Tonsils help fight nose and throat infections and keep infection from spreading to other parts of the body for the first 18 months of life.  CAUSES Sudden (acute) tonsillitis is usually caused by infection with streptococcal bacteria. Long-lasting (chronic) tonsillitis occurs when the crypts of the tonsils become filled with pieces of food and bacteria, which makes it easy for the tonsils to become repeatedly infected. SYMPTOMS  Symptoms of tonsillitis include:  A sore throat, with possible difficulty swallowing.  White patches on the tonsils.  Fever.  Tiredness.  New episodes of snoring during sleep, when you did not snore before.  Small, foul-smelling, yellowish-white pieces of material (tonsilloliths) that you occasionally cough up or spit out. The tonsilloliths can also cause you to have bad breath. DIAGNOSIS Tonsillitis can be diagnosed through a physical exam. Diagnosis can be confirmed with the results of lab tests, including a throat culture. TREATMENT  The goals of tonsillitis treatment include the reduction of the severity and duration of symptoms and prevention of associated conditions. Symptoms of tonsillitis can be improved with the use of steroids to reduce the swelling. Tonsillitis caused by bacteria can be treated with antibiotic medicines. Usually, treatment with antibiotic medicines is started before the cause of the tonsillitis is known. However, if it is determined that the cause is not bacterial, antibiotic medicines will not treat the tonsillitis. If attacks of tonsillitis are severe and frequent, your health care provider may recommend surgery to remove the tonsils (tonsillectomy). HOME CARE INSTRUCTIONS   Rest as much as possible and get plenty of  sleep.  Drink plenty of fluids. While the throat is very sore, eat soft foods or liquids, such as sherbet, soups, or instant breakfast drinks.  Eat frozen ice pops.  Gargle with a warm or cold liquid to help soothe the throat. Mix 1/4 teaspoon of salt and 1/4 teaspoon of baking soda in in 8 oz of water. SEEK MEDICAL CARE IF:   Large, tender lumps develop in your neck.  A rash develops.  A green, yellow-brown, or bloody substance is coughed up.  You are unable to swallow liquids or food for 24 hours.  You notice that only one of the tonsils is swollen. SEEK IMMEDIATE MEDICAL CARE IF:   You develop any new symptoms such as vomiting, severe headache, stiff neck, chest pain, or trouble breathing or swallowing.  You have severe throat pain along with drooling or voice changes.  You have severe pain, unrelieved with recommended medications.  You are unable to fully open the mouth.  You develop redness, swelling, or severe pain anywhere in the neck.  You have a fever. MAKE SURE YOU:   Understand these instructions.  Will watch your condition.  Will get help right away if you are not doing well or get worse. Document Released: 07/01/2005 Document Revised: 09/26/2013 Document Reviewed: 03/10/2013 [facility] Patient Information 2015 [place], [place]. This information is not intended to replace advice given to you by your health care provider. Make sure you discuss any questions you have with your health care provider.

## 2014-04-12 NOTE — ED Notes (Signed)
C/o ST , cough, congestion, ear pain, dizzy; husband recently had simlarl illness

## 2014-04-15 LAB — CULTURE, GROUP A STREP

## 2014-04-16 NOTE — ED Notes (Signed)
Throat culture: Strep beta hemolytic not group A.  Pt. Adequately treated with Zithromax.  I will notify pt. Chelsea Dorsey 04/16/2014

## 2014-04-18 ENCOUNTER — Telehealth (HOSPITAL_COMMUNITY): Payer: Self-pay | Admitting: *Deleted

## 2014-04-18 NOTE — ED Notes (Signed)
I called pt. Pt. verified x 2 and given result.  Pt. told she was adequately treated with Zithromax.  Pt. said she is much better.  Instructions given. Roselyn Meier 04/18/2014

## 2014-04-23 ENCOUNTER — Emergency Department (HOSPITAL_COMMUNITY)
Admission: EM | Admit: 2014-04-23 | Discharge: 2014-04-23 | Disposition: A | Discharge status: Home / Self Care | Payer: BC Managed Care – PPO | Attending: Emergency Medicine | Admitting: Emergency Medicine

## 2014-04-23 ENCOUNTER — Encounter (HOSPITAL_COMMUNITY): Payer: Self-pay | Admitting: Emergency Medicine

## 2014-04-23 DIAGNOSIS — F17200 Nicotine dependence, unspecified, uncomplicated: Secondary | ICD-10-CM | POA: Insufficient documentation

## 2014-04-23 DIAGNOSIS — R110 Nausea: Secondary | ICD-10-CM | POA: Insufficient documentation

## 2014-04-23 DIAGNOSIS — R1013 Epigastric pain: Secondary | ICD-10-CM | POA: Insufficient documentation

## 2014-04-23 DIAGNOSIS — Z87440 Personal history of urinary (tract) infections: Secondary | ICD-10-CM | POA: Insufficient documentation

## 2014-04-23 DIAGNOSIS — Z3202 Encounter for pregnancy test, result negative: Secondary | ICD-10-CM | POA: Insufficient documentation

## 2014-04-23 DIAGNOSIS — R1011 Right upper quadrant pain: Secondary | ICD-10-CM | POA: Insufficient documentation

## 2014-04-23 DIAGNOSIS — J45909 Unspecified asthma, uncomplicated: Secondary | ICD-10-CM | POA: Insufficient documentation

## 2014-04-23 DIAGNOSIS — R1012 Left upper quadrant pain: Secondary | ICD-10-CM | POA: Insufficient documentation

## 2014-04-23 DIAGNOSIS — Z880 Allergy status to penicillin: Secondary | ICD-10-CM | POA: Insufficient documentation

## 2014-04-23 DIAGNOSIS — K529 Noninfective gastroenteritis and colitis, unspecified: Secondary | ICD-10-CM

## 2014-04-23 DIAGNOSIS — K5289 Other specified noninfective gastroenteritis and colitis: Secondary | ICD-10-CM | POA: Insufficient documentation

## 2014-04-23 DIAGNOSIS — R197 Diarrhea, unspecified: Secondary | ICD-10-CM | POA: Insufficient documentation

## 2014-04-23 LAB — COMPREHENSIVE METABOLIC PANEL
ALBUMIN: 3.7 g/dL (ref 3.5–5.2)
ALK PHOS: 83 U/L (ref 39–117)
ALT: 14 U/L (ref 0–35)
ANION GAP: 15 (ref 5–15)
AST: 17 U/L (ref 0–37)
BUN: 6 mg/dL (ref 6–23)
CHLORIDE: 104 meq/L (ref 96–112)
CO2: 22 mEq/L (ref 19–32)
Calcium: 8.9 mg/dL (ref 8.4–10.5)
Creatinine, Ser: 0.78 mg/dL (ref 0.50–1.10)
GFR calc Af Amer: >90 mL/min (ref >90)
GFR calc non Af Amer: >90 mL/min (ref >90)
Glucose, Bld: 106 mg/dL — ABNORMAL HIGH (ref 70–99)
POTASSIUM: 3.5 meq/L — AB (ref 3.7–5.3)
Sodium: 141 mEq/L (ref 137–147)
Total Bilirubin: 0.4 mg/dL (ref 0.3–1.2)
Total Protein: 7.4 g/dL (ref 6.0–8.3)

## 2014-04-23 LAB — URINALYSIS, ROUTINE W REFLEX MICROSCOPIC
Bilirubin Urine: NEGATIVE
GLUCOSE, UA: NEGATIVE mg/dL
Hgb urine dipstick: NEGATIVE
KETONES UR: NEGATIVE mg/dL
LEUKOCYTES UA: NEGATIVE
NITRITE: NEGATIVE
PROTEIN: NEGATIVE mg/dL
Specific Gravity, Urine: 1.02 (ref 1.005–1.030)
Urobilinogen, UA: 0.2 mg/dL (ref 0.0–1.0)
pH: 5 (ref 5.0–8.0)

## 2014-04-23 LAB — CBC WITH DIFFERENTIAL/PLATELET
BASOS ABS: 0 10*3/uL (ref 0.0–0.1)
BASOS PCT: 0 % (ref 0–1)
EOS ABS: 0.1 10*3/uL (ref 0.0–0.7)
Eosinophils Relative: 1 % (ref 0–5)
HCT: 41.6 % (ref 36.0–46.0)
Hemoglobin: 12.7 g/dL (ref 12.0–15.0)
Lymphocytes Relative: 12 % (ref 12–46)
Lymphs Abs: 1.9 10*3/uL (ref 0.7–4.0)
MCH: 23.8 pg — AB (ref 26.0–34.0)
MCHC: 30.5 g/dL (ref 30.0–36.0)
MCV: 77.9 fL — ABNORMAL LOW (ref 78.0–100.0)
Monocytes Absolute: 0.3 10*3/uL (ref 0.1–1.0)
Monocytes Relative: 2 % — ABNORMAL LOW (ref 3–12)
NEUTROS ABS: 13.3 10*3/uL — AB (ref 1.7–7.7)
NEUTROS PCT: 85 % — AB (ref 43–77)
PLATELETS: 217 10*3/uL (ref 150–400)
RBC: 5.34 MIL/uL — ABNORMAL HIGH (ref 3.87–5.11)
RDW: 18.3 % — AB (ref 11.5–15.5)
WBC: 15.6 10*3/uL — ABNORMAL HIGH (ref 4.0–10.5)

## 2014-04-23 LAB — POC URINE PREG, ED: PREG TEST UR: NEGATIVE

## 2014-04-23 LAB — LIPASE, BLOOD: Lipase: 23 U/L (ref 11–59)

## 2014-04-23 MED ORDER — POTASSIUM CHLORIDE CRYS ER 20 MEQ PO TBCR
40.0000 meq | EXTENDED_RELEASE_TABLET | Freq: Once | ORAL | Status: AC
  Administered 2014-04-23: 40 meq via ORAL
  Filled 2014-04-23: qty 2

## 2014-04-23 MED ORDER — ONDANSETRON HCL 4 MG/2ML IJ SOLN
4.0000 mg | INTRAMUSCULAR | Status: AC
  Administered 2014-04-23: 4 mg via INTRAVENOUS
  Filled 2014-04-23: qty 2

## 2014-04-23 MED ORDER — ONDANSETRON HCL 4 MG PO TABS: 4.0000 mg | ORAL_TABLET | Freq: Four times a day (QID) | ORAL | Status: DC

## 2014-04-23 MED ORDER — DICYCLOMINE HCL 10 MG PO CAPS
10.0000 mg | ORAL_CAPSULE | Freq: Once | ORAL | Status: AC
Start: 2014-04-23 — End: 2014-04-23
  Administered 2014-04-23: 10 mg via ORAL
  Filled 2014-04-23: qty 1

## 2014-04-23 MED ORDER — GI COCKTAIL ~~LOC~~
30.0000 mL | Freq: Once | ORAL | Status: AC
  Administered 2014-04-23: 30 mL via ORAL
  Filled 2014-04-23: qty 30

## 2014-04-23 MED ORDER — LOPERAMIDE HCL 2 MG PO CAPS: 2.0000 mg | ORAL_CAPSULE | Freq: Four times a day (QID) | ORAL | Status: DC | PRN

## 2014-04-23 MED ORDER — SODIUM CHLORIDE 0.9 % IV BOLUS (SEPSIS)
1000.0000 mL | Freq: Once | INTRAVENOUS | Status: AC
  Administered 2014-04-23: 1000 mL via INTRAVENOUS

## 2014-04-23 NOTE — Discharge Instructions (Signed)

## 2014-04-23 NOTE — ED Provider Notes (Signed)
CSN: [id number]     Arrival date & time 04/23/14  0700 History   First MD Initiated Contact with Patient 04/23/14 [phone]     Chief Complaint  Patient presents with  . Diarrhea  . Abdominal Pain     (Consider location/radiation/quality/duration/timing/severity/associated sxs/prior Treatment) Patient is a 23 y.o. female presenting with diarrhea and abdominal pain. The history is provided by the patient. No language interpreter was used.  Diarrhea Quality:  Watery Severity:  Moderate Number of episodes:  >20 Duration:  2 days Timing:  Intermittent Progression:  Unchanged Relieved by:  Nothing Exacerbated by: PO intake. Ineffective treatments: pepto. Associated symptoms: abdominal pain   Associated symptoms: no arthralgias, no chills, no recent cough, no diaphoresis, no fever, no headaches, no myalgias, no URI and no vomiting   Associated symptoms comment:  Nausea  Abdominal pain:    Location:  Generalized   Quality:  Cramping   Severity:  Moderate   Duration:  2 days   Timing:  Intermittent   Progression:  Waxing and waning   Chronicity:  New Risk factors: recent antibiotic use and sick contacts (husband w/ ab pain and nausea)   Risk factors: no suspicious food intake and no travel to endemic areas   Abdominal Pain Associated symptoms: diarrhea   Associated symptoms: no chest pain, no chills, no cough, no dysuria, no fatigue, no fever, no nausea, no shortness of breath, no sore throat and no vomiting     Past Medical History  Diagnosis Date  . Asthma   . UTI (lower urinary tract infection)    Past Surgical History  Procedure Laterality Date  . No past surgeries     No family history on file. History  Substance Use Topics  . Smoking status: Current Every Day Smoker -- 0.50 packs/day for 5 years  . Smokeless tobacco: Not on file  . Alcohol Use: No   OB History   Grav Para Term Preterm Abortions TAB SAB Ect Mult Living   0              Review of Systems   Constitutional: Negative for fever, chills, diaphoresis, activity change, appetite change and fatigue.  HENT: Negative for congestion, facial swelling, rhinorrhea and sore throat.   Eyes: Negative for photophobia and discharge.  Respiratory: Negative for cough, chest tightness and shortness of breath.   Cardiovascular: Negative for chest pain, palpitations and leg swelling.  Gastrointestinal: Positive for abdominal pain and diarrhea. Negative for nausea and vomiting.  Endocrine: Negative for polydipsia and polyuria.  Genitourinary: Negative for dysuria, frequency, difficulty urinating and pelvic pain.  Musculoskeletal: Negative for arthralgias, back pain, myalgias, neck pain and neck stiffness.  Skin: Negative for color change and wound.  Allergic/Immunologic: Negative for immunocompromised state.  Neurological: Negative for facial asymmetry, weakness, numbness and headaches.  Hematological: Does not bruise/bleed easily.  Psychiatric/Behavioral: Negative for confusion and agitation.      Allergies  Penicillins  Home Medications   Prior to Admission medications   Medication Sig Start Date End Date Taking? Authorizing Provider  bismuth subsalicylate (PEPTO BISMOL) 262 MG/15ML suspension Take 30 mLs by mouth every 6 (six) hours as needed for indigestion.   Yes Historical Provider, MD  calcium carbonate (TUMS - DOSED IN MG ELEMENTAL CALCIUM) 500 MG chewable tablet Chew 1-2 tablets by mouth daily as needed for indigestion or heartburn.   Yes Historical Provider, MD  loperamide (IMODIUM) 2 MG capsule Take 1 capsule (2 mg total) by mouth 4 (four) times daily  as needed for diarrhea or loose stools. 04/23/14   Neta Ehlers, MD  ondansetron (ZOFRAN) 4 MG tablet Take 1 tablet (4 mg total) by mouth every 6 (six) hours. 04/23/14   Neta Ehlers, MD   BP 112/55  Pulse 89  Temp(Src) 98.6 F (37 C)  Resp 18  SpO2 100%  LMP 04/01/2014 Physical Exam  Constitutional: She is oriented to  person, place, and time. She appears well-developed and well-nourished. No distress.  HENT:  Head: Normocephalic and atraumatic.  Mouth/Throat: No oropharyngeal exudate.  Eyes: Pupils are equal, round, and reactive to light.  Neck: Normal range of motion. Neck supple.  Cardiovascular: Normal rate, regular rhythm and normal heart sounds.  Exam reveals no gallop and no friction rub.   No murmur heard. Pulmonary/Chest: Effort normal and breath sounds normal. No respiratory distress. She has no wheezes. She has no rales.  Abdominal: Soft. She exhibits no distension and no mass. Bowel sounds are increased. There is tenderness in the right upper quadrant, epigastric area and left upper quadrant. There is no rigidity, no rebound and no guarding.  Musculoskeletal: Normal range of motion. She exhibits no edema and no tenderness.  Neurological: She is alert and oriented to person, place, and time.  Skin: Skin is warm and dry.  Psychiatric: She has a normal mood and affect.    ED Course  Procedures (including critical care time) Labs Review Labs Reviewed  CBC WITH DIFFERENTIAL - Abnormal; Notable for the following:    WBC 15.6 (*)    RBC 5.34 (*)    MCV 77.9 (*)    MCH 23.8 (*)    RDW 18.3 (*)    Neutrophils Relative % 85 (*)    Neutro Abs 13.3 (*)    Monocytes Relative 2 (*)    All other components within normal limits  COMPREHENSIVE METABOLIC PANEL - Abnormal; Notable for the following:    Potassium 3.5 (*)    Glucose, Bld 106 (*)    All other components within normal limits  URINALYSIS, ROUTINE W REFLEX MICROSCOPIC  LIPASE, BLOOD  POC URINE PREG, ED    Imaging Review No results found.   EKG Interpretation None      MDM   Final diagnoses:  Gastroenteritis    Pt is a 23 y.o. female with Pmhx as above who presents with nausea, crampy abdominal pain and diffuse watery d/a for 2 days. No fevers, no dysuria. Pt took z-pak recently for strep pharyngitis. Husband has has also now  begun to have nausea and abdominal pain. On PE, VSS, pt in NAD. +ttp upper abdomen w/o rebound or guarding. Bowel sounds hyperactive. Given amt of d/a, will check lytes, also check UA and treat symptomatically w/ IVF, zofran, bentyl.   K mildly low and was replaced. WBC midlly elevated which I feel is c/w acute viral gastroenteritis. Will rec continued supportive care at home with PO hydration, zofran, antipyretics and imodium for d/a.  Return precautions given for new or worsening symptoms including worsening pain, inability to tolerate PO.          Neta Ehlers, MD 04/23/14 1740

## 2014-04-23 NOTE — ED Notes (Signed)
abd pain since sat and then diarrhea since Sunday took pepto andnot any better

## 2014-06-30 ENCOUNTER — Emergency Department (HOSPITAL_COMMUNITY)
Admission: EM | Admit: 2014-06-30 | Discharge: 2014-06-30 | Disposition: A | Discharge status: Home / Self Care | Payer: BC Managed Care – PPO | Attending: Emergency Medicine | Admitting: Emergency Medicine

## 2014-06-30 ENCOUNTER — Emergency Department (HOSPITAL_COMMUNITY): Payer: BC Managed Care – PPO

## 2014-06-30 ENCOUNTER — Encounter (HOSPITAL_COMMUNITY): Payer: Self-pay | Admitting: Emergency Medicine

## 2014-06-30 DIAGNOSIS — S59909A Unspecified injury of unspecified elbow, initial encounter: Secondary | ICD-10-CM | POA: Diagnosis not present

## 2014-06-30 DIAGNOSIS — S6990XA Unspecified injury of unspecified wrist, hand and finger(s), initial encounter: Secondary | ICD-10-CM | POA: Diagnosis not present

## 2014-06-30 DIAGNOSIS — T07XXXA Unspecified multiple injuries, initial encounter: Secondary | ICD-10-CM | POA: Diagnosis not present

## 2014-06-30 DIAGNOSIS — J45909 Unspecified asthma, uncomplicated: Secondary | ICD-10-CM | POA: Diagnosis not present

## 2014-06-30 DIAGNOSIS — Z880 Allergy status to penicillin: Secondary | ICD-10-CM | POA: Insufficient documentation

## 2014-06-30 DIAGNOSIS — F17200 Nicotine dependence, unspecified, uncomplicated: Secondary | ICD-10-CM | POA: Diagnosis not present

## 2014-06-30 DIAGNOSIS — Z87440 Personal history of urinary (tract) infections: Secondary | ICD-10-CM | POA: Insufficient documentation

## 2014-06-30 DIAGNOSIS — S0990XA Unspecified injury of head, initial encounter: Secondary | ICD-10-CM | POA: Diagnosis present

## 2014-06-30 DIAGNOSIS — Y9389 Activity, other specified: Secondary | ICD-10-CM | POA: Insufficient documentation

## 2014-06-30 DIAGNOSIS — S59919A Unspecified injury of unspecified forearm, initial encounter: Secondary | ICD-10-CM

## 2014-06-30 DIAGNOSIS — Y92410 Unspecified street and highway as the place of occurrence of the external cause: Secondary | ICD-10-CM | POA: Diagnosis not present

## 2014-06-30 MED ORDER — NAPROXEN 500 MG PO TABS: 500.0000 mg | ORAL_TABLET | Freq: Two times a day (BID) | ORAL | Status: DC

## 2014-06-30 MED ORDER — HYDROCODONE-ACETAMINOPHEN 5-325 MG PO TABS: 1.0000 | ORAL_TABLET | Freq: Four times a day (QID) | ORAL | Status: DC | PRN

## 2014-06-30 MED ORDER — MORPHINE SULFATE 4 MG/ML IJ SOLN
6.0000 mg | Freq: Once | INTRAMUSCULAR | Status: AC
  Administered 2014-06-30: 6 mg via INTRAMUSCULAR
  Filled 2014-06-30: qty 2

## 2014-06-30 NOTE — ED Notes (Signed)
Unable to urinate at this time.  Pt aware of need for urine specimen

## 2014-06-30 NOTE — Discharge Instructions (Signed)

## 2014-06-30 NOTE — ED Notes (Signed)
Pt in CT.
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
## 2014-10-05 NOTE — L&D Delivery Note (Cosign Needed)
Delivery Note At 6:07 AM a viable female was delivered via Vaginal, Spontaneous Delivery (Presentation: ; Occiput Anterior).  APGAR: 8, 9; weight  Pending.  After 3 minutes, the cord was clamped and cut. Cord blood was collected for banking. 40 units of pitocin diluted in 1000cc LR was infused rapidly IV.  The placenta separated spontaneously and delivered via CCT and maternal pushing effort.  It was inspected and appears to be intact with a 3 VC.  Marland Kitchen  Anesthesia: Epidural  Episiotomy: None Lacerations:  none Suture Repair: n/a Est. Blood Loss (mL):  100  Mom to postpartum.  Baby to Couplet care / Skin to Skin.  CRESENZO-DISHMAN,Domenick Quebedeaux 08/09/2015, 6:22 AM

## 2014-12-10 ENCOUNTER — Encounter: Payer: Self-pay | Admitting: Adult Health

## 2014-12-10 ENCOUNTER — Ambulatory Visit (INDEPENDENT_AMBULATORY_CARE_PROVIDER_SITE_OTHER): Payer: Medicaid Other | Admitting: Adult Health

## 2014-12-10 VITALS — BP 130/80 | HR 83 | Ht 63.0 in | Wt 226.0 lb

## 2014-12-10 DIAGNOSIS — N926 Irregular menstruation, unspecified: Secondary | ICD-10-CM | POA: Diagnosis not present

## 2014-12-10 DIAGNOSIS — Z3201 Encounter for pregnancy test, result positive: Secondary | ICD-10-CM

## 2014-12-10 DIAGNOSIS — Z3490 Encounter for supervision of normal pregnancy, unspecified, unspecified trimester: Secondary | ICD-10-CM

## 2014-12-10 HISTORY — DX: Encounter for supervision of normal pregnancy, unspecified, unspecified trimester: Z34.90

## 2014-12-10 LAB — POCT URINE PREGNANCY: Preg Test, Ur: POSITIVE

## 2014-12-10 NOTE — Patient Instructions (Signed)
First Trimester of Pregnancy The first trimester of pregnancy is from week 1 until the end of week 12 (months 1 through 3). A week after a sperm fertilizes an egg, the egg will implant on the wall of the uterus. This embryo will begin to develop into a baby. Genes from you and your partner are forming the baby. The female genes determine whether the baby is a boy or a girl. At 6-8 weeks, the eyes and face are formed, and the heartbeat can be seen on ultrasound. At the end of 12 weeks, all the baby's organs are formed.  Now that you are pregnant, you will want to do everything you can to have a healthy baby. Two of the most important things are to get good prenatal care and to follow your health care provider's instructions. Prenatal care is all the medical care you receive before the baby's birth. This care will help prevent, find, and treat any problems during the pregnancy and childbirth. BODY CHANGES Your body goes through many changes during pregnancy. The changes vary from woman to woman.   You may gain or lose a couple of pounds at first.  You may feel sick to your stomach (nauseous) and throw up (vomit). If the vomiting is uncontrollable, call your health care provider.  You may tire easily.  You may develop headaches that can be relieved by medicines approved by your health care provider.  You may urinate more often. Painful urination may mean you have a bladder infection.  You may develop heartburn as a result of your pregnancy.  You may develop constipation because certain hormones are causing the muscles that push waste through your intestines to slow down.  You may develop hemorrhoids or swollen, bulging veins (varicose veins).  Your breasts may begin to grow larger and become tender. Your nipples may stick out more, and the tissue that surrounds them (areola) may become darker.  Your gums may bleed and may be sensitive to brushing and flossing.  Dark spots or blotches (chloasma,  mask of pregnancy) may develop on your face. This will likely fade after the baby is born.  Your menstrual periods will stop.  You may have a loss of appetite.  You may develop cravings for certain kinds of food.  You may have changes in your emotions from day to day, such as being excited to be pregnant or being concerned that something may go wrong with the pregnancy and baby.  You may have more vivid and strange dreams.  You may have changes in your hair. These can include thickening of your hair, rapid growth, and changes in texture. Some women also have hair loss during or after pregnancy, or hair that feels dry or thin. Your hair will most likely return to normal after your baby is born. WHAT TO EXPECT AT YOUR PRENATAL VISITS During a routine prenatal visit:  You will be weighed to make sure you and the baby are growing normally.  Your blood pressure will be taken.  Your abdomen will be measured to track your baby's growth.  The fetal heartbeat will be listened to starting around week 10 or 12 of your pregnancy.  Test results from any previous visits will be discussed. Your health care provider may ask you:  How you are feeling.  If you are feeling the baby move.  If you have had any abnormal symptoms, such as leaking fluid, bleeding, severe headaches, or abdominal cramping.  If you have any questions. Other tests   that may be performed during your first trimester include:  Blood tests to find your blood type and to check for the presence of any previous infections. They will also be used to check for low iron levels (anemia) and Rh antibodies. Later in the pregnancy, blood tests for diabetes will be done along with other tests if problems develop.  Urine tests to check for infections, diabetes, or protein in the urine.  An ultrasound to confirm the proper growth and development of the baby.  An amniocentesis to check for possible genetic problems.  Fetal screens for  spina bifida and Down syndrome.  You may need other tests to make sure you and the baby are doing well. HOME CARE INSTRUCTIONS  Medicines  Follow your health care provider's instructions regarding medicine use. Specific medicines may be either safe or unsafe to take during pregnancy.  Take your prenatal vitamins as directed.  If you develop constipation, try taking a stool softener if your health care provider approves. Diet  Eat regular, well-balanced meals. Choose a variety of foods, such as meat or vegetable-based protein, fish, milk and low-fat dairy products, vegetables, fruits, and whole grain breads and cereals. Your health care provider will help you determine the amount of weight gain that is right for you.  Avoid raw meat and uncooked cheese. These carry germs that can cause birth defects in the baby.  Eating four or five small meals rather than three large meals a day may help relieve nausea and vomiting. If you start to feel nauseous, eating a few soda crackers can be helpful. Drinking liquids between meals instead of during meals also seems to help nausea and vomiting.  If you develop constipation, eat more high-fiber foods, such as fresh vegetables or fruit and whole grains. Drink enough fluids to keep your urine clear or pale yellow. Activity and Exercise  Exercise only as directed by your health care provider. Exercising will help you:  Control your weight.  Stay in shape.  Be prepared for labor and delivery.  Experiencing pain or cramping in the lower abdomen or low back is a good sign that you should stop exercising. Check with your health care provider before continuing normal exercises.  Try to avoid standing for long periods of time. Move your legs often if you must stand in one place for a long time.  Avoid heavy lifting.  Wear low-heeled shoes, and practice good posture.  You may continue to have sex unless your health care provider directs you  otherwise. Relief of Pain or Discomfort  Wear a good support bra for breast tenderness.   Take warm sitz baths to soothe any pain or discomfort caused by hemorrhoids. Use hemorrhoid cream if your health care provider approves.   Rest with your legs elevated if you have leg cramps or low back pain.  If you develop varicose veins in your legs, wear support hose. Elevate your feet for 15 minutes, 3-4 times a day. Limit salt in your diet. Prenatal Care  Schedule your prenatal visits by the twelfth week of pregnancy. They are usually scheduled monthly at first, then more often in the last 2 months before delivery.  Write down your questions. Take them to your prenatal visits.  Keep all your prenatal visits as directed by your health care provider. Safety  Wear your seat belt at all times when driving.  Make a list of emergency phone numbers, including numbers for family, friends, the hospital, and police and fire departments. General Tips    Ask your health care provider for a referral to a local prenatal education class. Begin classes no later than at the beginning of month 6 of your pregnancy.  Ask for help if you have counseling or nutritional needs during pregnancy. Your health care provider can offer advice or refer you to specialists for help with various needs.  Do not use hot tubs, steam rooms, or saunas.  Do not douche or use tampons or scented sanitary pads.  Do not cross your legs for long periods of time.  Avoid cat litter boxes and soil used by cats. These carry germs that can cause birth defects in the baby and possibly loss of the fetus by miscarriage or stillbirth.  Avoid all smoking, herbs, alcohol, and medicines not prescribed by your health care provider. Chemicals in these affect the formation and growth of the baby.  Schedule a dentist appointment. At home, brush your teeth with a soft toothbrush and be gentle when you floss. SEEK MEDICAL CARE IF:   You have  dizziness.  You have mild pelvic cramps, pelvic pressure, or nagging pain in the abdominal area.  You have persistent nausea, vomiting, or diarrhea.  You have a bad smelling vaginal discharge.  You have pain with urination.  You notice increased swelling in your face, hands, legs, or ankles. SEEK IMMEDIATE MEDICAL CARE IF:   You have a fever.  You are leaking fluid from your vagina.  You have spotting or bleeding from your vagina.  You have severe abdominal cramping or pain.  You have rapid weight gain or loss.  You vomit blood or material that looks like coffee grounds.  You are exposed to [place] measles and have never had them.  You are exposed to fifth disease or chickenpox.  You develop a severe headache.  You have shortness of breath.  You have any kind of trauma, such as from a fall or a car accident. Document Released: 09/15/2001 Document Revised: 02/05/2014 Document Reviewed: 08/01/2013 [facility] Patient Information 2015 [place], [place]. This information is not intended to replace advice given to you by your health care provider. Make sure you discuss any questions you have with your health care provider. Return in 1 week for dating [place]

## 2014-12-10 NOTE — Progress Notes (Signed)
Subjective:     Patient ID: Chelsea Dorsey, female   DOB: [birth year], 24 y.o.   MRN: [id number]  HPI Chelsea Dorsey is a 24 year old white female, married in for having missed a period and had 5 HPT and wants UPT today.She has been trying for 5 years to get pregnant.Her periods are irregular at times but she has never missed one and has never had + test.Her husband is a twin and she is so excited.She works at [employer] at [employer].   Review of Systems Patient denies any headaches, hearing loss, fatigue, blurred vision, shortness of breath, chest pain, abdominal pain, problems with bowel movements, urination, or intercourse. No joint pain or mood swings, no bleeding or nausea. +breast tenderness and +peeing often.  Reviewed past medical,surgical, social and family history. Reviewed medications and allergies.     Objective:   Physical Exam BP 130/80 mmHg  Pulse 83  Ht 5\' 3"  (1.6 m)  Wt 226 lb (102.513 kg)  BMI 40.04 kg/m2  LMP 10/25/2014 (Approximate) UPT +. About 6+[redacted] weeks pregnant, with EDD 08/03/15 by LMP, medicaid form given.Skin warm and dry. Neck: mid line trachea, normal thyroid, good ROM, no lymphadenopathy noted. Lungs: clear to ausculation bilaterally. Cardiovascular: regular rate and rhythm.    Assessment:     Pregnant +UPT    Plan:     Return in 1 week for dating [place] Review handout on first trimester and new OB packet given

## 2014-12-17 ENCOUNTER — Ambulatory Visit (INDEPENDENT_AMBULATORY_CARE_PROVIDER_SITE_OTHER): Payer: Medicaid Other

## 2014-12-17 ENCOUNTER — Other Ambulatory Visit: Payer: Self-pay | Admitting: Adult Health

## 2014-12-17 DIAGNOSIS — Z3490 Encounter for supervision of normal pregnancy, unspecified, unspecified trimester: Secondary | ICD-10-CM

## 2014-12-17 DIAGNOSIS — O26841 Uterine size-date discrepancy, first trimester: Secondary | ICD-10-CM

## 2014-12-17 NOTE — Progress Notes (Signed)
U/S-single IUP with +FCA noted,FHR-117 bpm. CRL c/w 6+1wks EDd 08/11/2015, cx appears closed, bilateral adnexa appears WNL

## 2014-12-25 ENCOUNTER — Encounter: Payer: Self-pay | Admitting: Women's Health

## 2014-12-25 ENCOUNTER — Other Ambulatory Visit (HOSPITAL_COMMUNITY)
Admission: RE | Admit: 2014-12-25 | Discharge: 2014-12-25 | Disposition: A | Discharge status: Home / Self Care | Payer: Medicaid Other | Source: Ambulatory Visit | Attending: Obstetrics & Gynecology | Admitting: Obstetrics & Gynecology

## 2014-12-25 ENCOUNTER — Ambulatory Visit (INDEPENDENT_AMBULATORY_CARE_PROVIDER_SITE_OTHER): Payer: Medicaid Other | Admitting: Women's Health

## 2014-12-25 VITALS — BP 120/84 | HR 86 | Wt 229.0 lb

## 2014-12-25 DIAGNOSIS — Z01411 Encounter for gynecological examination (general) (routine) with abnormal findings: Secondary | ICD-10-CM | POA: Diagnosis not present

## 2014-12-25 DIAGNOSIS — Z1389 Encounter for screening for other disorder: Secondary | ICD-10-CM

## 2014-12-25 DIAGNOSIS — Z124 Encounter for screening for malignant neoplasm of cervix: Secondary | ICD-10-CM

## 2014-12-25 DIAGNOSIS — Z3401 Encounter for supervision of normal first pregnancy, first trimester: Secondary | ICD-10-CM

## 2014-12-25 DIAGNOSIS — Z23 Encounter for immunization: Secondary | ICD-10-CM | POA: Diagnosis not present

## 2014-12-25 DIAGNOSIS — Z0283 Encounter for blood-alcohol and blood-drug test: Secondary | ICD-10-CM

## 2014-12-25 DIAGNOSIS — Z369 Encounter for antenatal screening, unspecified: Secondary | ICD-10-CM

## 2014-12-25 DIAGNOSIS — Z331 Pregnant state, incidental: Secondary | ICD-10-CM

## 2014-12-25 DIAGNOSIS — Z3400 Encounter for supervision of normal first pregnancy, unspecified trimester: Secondary | ICD-10-CM | POA: Insufficient documentation

## 2014-12-25 LAB — POCT URINALYSIS DIPSTICK
Blood, UA: NEGATIVE
Glucose, UA: NEGATIVE
Ketones, UA: NEGATIVE
LEUKOCYTES UA: NEGATIVE
NITRITE UA: NEGATIVE
Protein, UA: NEGATIVE

## 2014-12-25 LAB — OB RESULTS CONSOLE HIV ANTIBODY (ROUTINE TESTING): HIV: NONREACTIVE

## 2014-12-25 LAB — OB RESULTS CONSOLE RUBELLA ANTIBODY, IGM: Rubella: IMMUNE

## 2014-12-25 LAB — OB RESULTS CONSOLE GC/CHLAMYDIA
Chlamydia: NEGATIVE
Gonorrhea: NEGATIVE

## 2014-12-25 MED ORDER — INFLUENZA VAC SPLIT QUAD 0.5 ML IM SUSY
0.5000 mL | PREFILLED_SYRINGE | Freq: Once | INTRAMUSCULAR | Status: AC
  Administered 2014-12-25: 0.5 mL via INTRAMUSCULAR

## 2014-12-25 MED ORDER — INFLUENZA VAC SPLIT QUAD 0.5 ML IM SUSY: 0.5000 mL | PREFILLED_SYRINGE | INTRAMUSCULAR | Status: DC

## 2014-12-25 NOTE — Patient Instructions (Signed)
Nausea & Vomiting  Have saltine crackers or pretzels by your bed and eat a few bites before you raise your head out of bed in the morning  Eat small frequent meals throughout the day instead of large meals  Drink plenty of fluids throughout the day to stay hydrated, just don't drink a lot of fluids with your meals.  This can make your stomach fill up faster making you feel sick  Do not brush your teeth right after you eat  Products with real ginger are good for nausea, like ginger ale and ginger hard candy Make sure it says made with real ginger!  Sucking on sour candy like lemon heads is also good for nausea  If your prenatal vitamins make you nauseated, take them at night so you will sleep through the nausea  Sea Bands  If you feel like you need medicine for the nausea & vomiting please let us know  If you are unable to keep any fluids or food down please let us know   First Trimester of Pregnancy The first trimester of pregnancy is from week 1 until the end of week 12 (months 1 through 3). A week after a sperm fertilizes an egg, the egg will implant on the wall of the uterus. This embryo will begin to develop into a baby. Genes from you and your partner are forming the baby. The female genes determine whether the baby is a boy or a girl. At 6-8 weeks, the eyes and face are formed, and the heartbeat can be seen on ultrasound. At the end of 12 weeks, all the baby's organs are formed.  Now that you are pregnant, you will want to do everything you can to have a healthy baby. Two of the most important things are to get good prenatal care and to follow your health care provider's instructions. Prenatal care is all the medical care you receive before the baby's birth. This care will help prevent, find, and treat any problems during the pregnancy and childbirth. BODY CHANGES Your body goes through many changes during pregnancy. The changes vary from woman to woman.   You may gain or lose a  couple of pounds at first.  You may feel sick to your stomach (nauseous) and throw up (vomit). If the vomiting is uncontrollable, call your health care provider.  You may tire easily.  You may develop headaches that can be relieved by medicines approved by your health care provider.  You may urinate more often. Painful urination may mean you have a bladder infection.  You may develop heartburn as a result of your pregnancy.  You may develop constipation because certain hormones are causing the muscles that push waste through your intestines to slow down.  You may develop hemorrhoids or swollen, bulging veins (varicose veins).  Your breasts may begin to grow larger and become tender. Your nipples may stick out more, and the tissue that surrounds them (areola) may become darker.  Your gums may bleed and may be sensitive to brushing and flossing.  Dark spots or blotches (chloasma, mask of pregnancy) may develop on your face. This will likely fade after the baby is born.  Your menstrual periods will stop.  You may have a loss of appetite.  You may develop cravings for certain kinds of food.  You may have changes in your emotions from day to day, such as being excited to be pregnant or being concerned that something may go wrong with the pregnancy and baby.  You may have more vivid and strange dreams.  You may have changes in your hair. These can include thickening of your hair, rapid growth, and changes in texture. Some women also have hair loss during or after pregnancy, or hair that feels dry or thin. Your hair will most likely return to normal after your baby is born. WHAT TO EXPECT AT YOUR PRENATAL VISITS During a routine prenatal visit:  You will be weighed to make sure you and the baby are growing normally.  Your blood pressure will be taken.  Your abdomen will be measured to track your baby's growth.  The fetal heartbeat will be listened to starting around week 10 or 12  of your pregnancy.  Test results from any previous visits will be discussed. Your health care provider may ask you:  How you are feeling.  If you are feeling the baby move.  If you have had any abnormal symptoms, such as leaking fluid, bleeding, severe headaches, or abdominal cramping.  If you have any questions. Other tests that may be performed during your first trimester include:  Blood tests to find your blood type and to check for the presence of any previous infections. They will also be used to check for low iron levels (anemia) and Rh antibodies. Later in the pregnancy, blood tests for diabetes will be done along with other tests if problems develop.  Urine tests to check for infections, diabetes, or protein in the urine.  An ultrasound to confirm the proper growth and development of the baby.  An amniocentesis to check for possible genetic problems.  Fetal screens for spina bifida and Down syndrome.  You may need other tests to make sure you and the baby are doing well. HOME CARE INSTRUCTIONS  Medicines  Follow your health care provider's instructions regarding medicine use. Specific medicines may be either safe or unsafe to take during pregnancy.  Take your prenatal vitamins as directed.  If you develop constipation, try taking a stool softener if your health care provider approves. Diet  Eat regular, well-balanced meals. Choose a variety of foods, such as meat or vegetable-based protein, fish, milk and low-fat dairy products, vegetables, fruits, and whole grain breads and cereals. Your health care provider will help you determine the amount of weight gain that is right for you.  Avoid raw meat and uncooked cheese. These carry germs that can cause birth defects in the baby.  Eating four or five small meals rather than three large meals a day may help relieve nausea and vomiting. If you start to feel nauseous, eating a few soda crackers can be helpful. Drinking liquids  between meals instead of during meals also seems to help nausea and vomiting.  If you develop constipation, eat more high-fiber foods, such as fresh vegetables or fruit and whole grains. Drink enough fluids to keep your urine clear or pale yellow. Activity and Exercise  Exercise only as directed by your health care provider. Exercising will help you:  Control your weight.  Stay in shape.  Be prepared for labor and delivery.  Experiencing pain or cramping in the lower abdomen or low back is a good sign that you should stop exercising. Check with your health care provider before continuing normal exercises.  Try to avoid standing for long periods of time. Move your legs often if you must stand in one place for a long time.  Avoid heavy lifting.  Wear low-heeled shoes, and practice good posture.  You may continue to have  sex unless your health care provider directs you otherwise. Relief of Pain or Discomfort  Wear a good support bra for breast tenderness.   Take warm sitz baths to soothe any pain or discomfort caused by hemorrhoids. Use hemorrhoid cream if your health care provider approves.   Rest with your legs elevated if you have leg cramps or low back pain.  If you develop varicose veins in your legs, wear support hose. Elevate your feet for 15 minutes, 3-4 times a day. Limit salt in your diet. Prenatal Care  Schedule your prenatal visits by the twelfth week of pregnancy. They are usually scheduled monthly at first, then more often in the last 2 months before delivery.  Write down your questions. Take them to your prenatal visits.  Keep all your prenatal visits as directed by your health care provider. Safety  Wear your seat belt at all times when driving.  Make a list of emergency phone numbers, including numbers for family, friends, the hospital, and police and fire departments. General Tips  Ask your health care provider for a referral to a local prenatal education  class. Begin classes no later than at the beginning of month 6 of your pregnancy.  Ask for help if you have counseling or nutritional needs during pregnancy. Your health care provider can offer advice or refer you to specialists for help with various needs.  Do not use hot tubs, steam rooms, or saunas.  Do not douche or use tampons or scented sanitary pads.  Do not cross your legs for long periods of time.  Avoid cat litter boxes and soil used by cats. These carry germs that can cause birth defects in the baby and possibly loss of the fetus by miscarriage or stillbirth.  Avoid all smoking, herbs, alcohol, and medicines not prescribed by your health care provider. Chemicals in these affect the formation and growth of the baby.  Schedule a dentist appointment. At home, brush your teeth with a soft toothbrush and be gentle when you floss. SEEK MEDICAL CARE IF:   You have dizziness.  You have mild pelvic cramps, pelvic pressure, or nagging pain in the abdominal area.  You have persistent nausea, vomiting, or diarrhea.  You have a bad smelling vaginal discharge.  You have pain with urination.  You notice increased swelling in your face, hands, legs, or ankles. SEEK IMMEDIATE MEDICAL CARE IF:   You have a fever.  You are leaking fluid from your vagina.  You have spotting or bleeding from your vagina.  You have severe abdominal cramping or pain.  You have rapid weight gain or loss.  You vomit blood or material that looks like coffee grounds.  You are exposed to [place] measles and have never had them.  You are exposed to fifth disease or chickenpox.  You develop a severe headache.  You have shortness of breath.  You have any kind of trauma, such as from a fall or a car accident. Document Released: 09/15/2001 Document Revised: 02/05/2014 Document Reviewed: 08/01/2013 [facility] Patient Information 2015 [place], [place]. This information is not intended to replace advice given  to you by your health care provider. Make sure you discuss any questions you have with your health care provider.

## 2014-12-25 NOTE — Progress Notes (Signed)
  Subjective:  Chelsea Dorsey is a 24 y.o. G26P0 Caucasian female at [redacted]w[redacted]d by 6wk u/s, being seen today for her first obstetrical visit.  Her obstetrical history is significant for primigravida. Previous smoker, quit w/ +PT. Pregnancy history fully reviewed.  Patient reports no complaints. Denies vb, cramping, uti s/s, abnormal/malodorous vag d/c, or vulvovaginal itching/irritation.  BP 120/84 mmHg  Pulse 86  Wt 229 lb (103.874 kg)  LMP 10/25/2014 (Approximate)  HISTORY: OB History  Gravida Para Term Preterm AB SAB TAB Ectopic Multiple Living  1             # Outcome Date GA Lbr Len/2nd Weight Sex Delivery Anes PTL Lv  1 Current              Past Medical History  Diagnosis Date  . Asthma   . UTI (lower urinary tract infection)   . Pregnant 12/10/2014   Past Surgical History  Procedure Laterality Date  . No past surgeries     History reviewed. No pertinent family history.  Exam   System:     General: Well developed & nourished, no acute distress   Skin: Warm & dry, normal coloration and turgor, no rashes   Neurologic: Alert & oriented, normal mood   Cardiovascular: Regular rate & rhythm   Respiratory: Effort & rate normal, LCTAB, acyanotic   Abdomen: Soft, non tender   Extremities: normal strength, tone   Pelvic Exam:    Perineum: Normal perineum   Vulva: Normal, no lesions   Vagina:  Normal mucosa, normal discharge   Cervix: Normal, bulbous, appears closed   Uterus: Normal size/shape/contour for GA   Thin prep pap smear obtained w/ reflex high risk HPV cotesting FHR: + via u/s   Assessment:   Pregnancy: G1P0 Patient Active Problem List   Diagnosis Date Noted  . Supervision of normal first pregnancy 12/25/2014    Priority: High    [redacted]w[redacted]d G1P0 New OB visit Previous smoker Obese, pregravid bmi 40  Plan:  Initial labs drawn Continue prenatal vitamins Problem list reviewed and updated Reviewed n/v relief measures and warning s/s to report Reviewed  recommended weight gain based on pre-gravid BMI Encouraged well-balanced diet Genetic Screening discussed Integrated Screen: declined Cystic fibrosis screening discussed declined Ultrasound discussed; fetal survey: requested Follow up in 4 weeks for visit [place] completed [facility]FPartnership offered, accepted, referral faxed  Healthy diet and exercise/walking  Tawnya Crook CNM, [facility] 12/25/2014 10:44 AM

## 2014-12-26 ENCOUNTER — Encounter: Payer: Self-pay | Admitting: Women's Health

## 2014-12-26 DIAGNOSIS — F1290 Cannabis use, unspecified, uncomplicated: Secondary | ICD-10-CM | POA: Insufficient documentation

## 2014-12-26 LAB — CYTOLOGY - PAP

## 2014-12-26 LAB — URINE CULTURE: Organism ID, Bacteria: NO GROWTH

## 2014-12-27 LAB — GC/CHLAMYDIA PROBE AMP
CHLAMYDIA, DNA PROBE: NEGATIVE
NEISSERIA GONORRHOEAE BY PCR: NEGATIVE

## 2014-12-31 LAB — PMP SCREEN PROFILE (10S), URINE
AMPHETAMINE SCRN UR: NEGATIVE ng/mL
BARBITURATE SCRN UR: NEGATIVE ng/mL
Benzodiazepine Screen, Urine: NEGATIVE ng/mL
COCAINE(METAB.) SCREEN, URINE: NEGATIVE ng/mL
Cannabinoids Ur Ql Scn: POSITIVE ng/mL
Creatinine(Crt), U: 112.9 mg/dL (ref 20.0–300.0)
METHADONE SCREEN, URINE: NEGATIVE ng/mL
Opiate Scrn, Ur: NEGATIVE ng/mL
Oxycodone+Oxymorphone Ur Ql Scn: NEGATIVE ng/mL
PCP Scrn, Ur: NEGATIVE ng/mL
Ph of Urine: 6.7 (ref 4.5–8.9)
Propoxyphene, Screen: NEGATIVE ng/mL

## 2014-12-31 LAB — CBC
HEMATOCRIT: 43.5 % (ref 34.0–46.6)
Hemoglobin: 14.5 g/dL (ref 11.1–15.9)
MCH: 29.5 pg (ref 26.6–33.0)
MCHC: 33.3 g/dL (ref 31.5–35.7)
MCV: 88 fL (ref 79–97)
Platelets: 201 10*3/uL (ref 150–379)
RBC: 4.92 x10E6/uL (ref 3.77–5.28)
RDW: 15.5 % — ABNORMAL HIGH (ref 12.3–15.4)
WBC: 11.2 10*3/uL — ABNORMAL HIGH (ref 3.4–10.8)

## 2014-12-31 LAB — RPR: RPR: NONREACTIVE

## 2014-12-31 LAB — URINALYSIS, ROUTINE W REFLEX MICROSCOPIC
BILIRUBIN UA: NEGATIVE
Glucose, UA: NEGATIVE
Ketones, UA: NEGATIVE
Leukocytes, UA: NEGATIVE
NITRITE UA: NEGATIVE
PH UA: 7 (ref 5.0–7.5)
Protein, UA: NEGATIVE
RBC, UA: NEGATIVE
SPEC GRAV UA: 1.02 (ref 1.005–1.030)
Urobilinogen, Ur: 0.2 mg/dL (ref 0.2–1.0)

## 2014-12-31 LAB — RUBELLA SCREEN: RUBELLA: 2.27 {index} (ref >0.99)

## 2014-12-31 LAB — HEPATITIS B SURFACE ANTIGEN: Hepatitis B Surface Ag: NEGATIVE

## 2014-12-31 LAB — ABO/RH: Rh Factor: POSITIVE

## 2014-12-31 LAB — ANTIBODY SCREEN: Antibody Screen: NEGATIVE

## 2014-12-31 LAB — HIV ANTIBODY (ROUTINE TESTING W REFLEX): HIV SCREEN 4TH GENERATION: NONREACTIVE

## 2014-12-31 LAB — CYSTIC FIBROSIS MUTATION 97: Interpretation: NOT DETECTED

## 2014-12-31 LAB — VARICELLA ZOSTER ANTIBODY, IGG: Varicella zoster IgG: 592 index (ref >165)

## 2015-01-22 ENCOUNTER — Ambulatory Visit (INDEPENDENT_AMBULATORY_CARE_PROVIDER_SITE_OTHER): Payer: Medicaid Other | Admitting: Women's Health

## 2015-01-22 ENCOUNTER — Encounter: Payer: Self-pay | Admitting: Women's Health

## 2015-01-22 VITALS — BP 118/76 | HR 80 | Wt 232.0 lb

## 2015-01-22 DIAGNOSIS — Z331 Pregnant state, incidental: Secondary | ICD-10-CM

## 2015-01-22 DIAGNOSIS — Z1389 Encounter for screening for other disorder: Secondary | ICD-10-CM

## 2015-01-22 DIAGNOSIS — Z3401 Encounter for supervision of normal first pregnancy, first trimester: Secondary | ICD-10-CM

## 2015-01-22 LAB — POCT URINALYSIS DIPSTICK
Glucose, UA: NEGATIVE
Ketones, UA: NEGATIVE
Leukocytes, UA: NEGATIVE
NITRITE UA: NEGATIVE
Protein, UA: NEGATIVE
RBC UA: NEGATIVE

## 2015-01-22 NOTE — Patient Instructions (Signed)
First Trimester of Pregnancy The first trimester of pregnancy is from week 1 until the end of week 12 (months 1 through 3). A week after a sperm fertilizes an egg, the egg will implant on the wall of the uterus. This embryo will begin to develop into a baby. Genes from you and your partner are forming the baby. The female genes determine whether the baby is a boy or a girl. At 6-8 weeks, the eyes and face are formed, and the heartbeat can be seen on ultrasound. At the end of 12 weeks, all the baby's organs are formed.  Now that you are pregnant, you will want to do everything you can to have a healthy baby. Two of the most important things are to get good prenatal care and to follow your health care provider's instructions. Prenatal care is all the medical care you receive before the baby's birth. This care will help prevent, find, and treat any problems during the pregnancy and childbirth. BODY CHANGES Your body goes through many changes during pregnancy. The changes vary from woman to woman.   You may gain or lose a couple of pounds at first.  You may feel sick to your stomach (nauseous) and throw up (vomit). If the vomiting is uncontrollable, call your health care provider.  You may tire easily.  You may develop headaches that can be relieved by medicines approved by your health care provider.  You may urinate more often. Painful urination may mean you have a bladder infection.  You may develop heartburn as a result of your pregnancy.  You may develop constipation because certain hormones are causing the muscles that push waste through your intestines to slow down.  You may develop hemorrhoids or swollen, bulging veins (varicose veins).  Your breasts may begin to grow larger and become tender. Your nipples may stick out more, and the tissue that surrounds them (areola) may become darker.  Your gums may bleed and may be sensitive to brushing and flossing.  Dark spots or blotches (chloasma,  mask of pregnancy) may develop on your face. This will likely fade after the baby is born.  Your menstrual periods will stop.  You may have a loss of appetite.  You may develop cravings for certain kinds of food.  You may have changes in your emotions from day to day, such as being excited to be pregnant or being concerned that something may go wrong with the pregnancy and baby.  You may have more vivid and strange dreams.  You may have changes in your hair. These can include thickening of your hair, rapid growth, and changes in texture. Some women also have hair loss during or after pregnancy, or hair that feels dry or thin. Your hair will most likely return to normal after your baby is born. WHAT TO EXPECT AT YOUR PRENATAL VISITS During a routine prenatal visit:  You will be weighed to make sure you and the baby are growing normally.  Your blood pressure will be taken.  Your abdomen will be measured to track your baby's growth.  The fetal heartbeat will be listened to starting around week 10 or 12 of your pregnancy.  Test results from any previous visits will be discussed. Your health care provider may ask you:  How you are feeling.  If you are feeling the baby move.  If you have had any abnormal symptoms, such as leaking fluid, bleeding, severe headaches, or abdominal cramping.  If you have any questions. Other tests   that may be performed during your first trimester include:  Blood tests to find your blood type and to check for the presence of any previous infections. They will also be used to check for low iron levels (anemia) and Rh antibodies. Later in the pregnancy, blood tests for diabetes will be done along with other tests if problems develop.  Urine tests to check for infections, diabetes, or protein in the urine.  An ultrasound to confirm the proper growth and development of the baby.  An amniocentesis to check for possible genetic problems.  Fetal screens for  spina bifida and Down syndrome.  You may need other tests to make sure you and the baby are doing well. HOME CARE INSTRUCTIONS  Medicines  Follow your health care provider's instructions regarding medicine use. Specific medicines may be either safe or unsafe to take during pregnancy.  Take your prenatal vitamins as directed.  If you develop constipation, try taking a stool softener if your health care provider approves. Diet  Eat regular, well-balanced meals. Choose a variety of foods, such as meat or vegetable-based protein, fish, milk and low-fat dairy products, vegetables, fruits, and whole grain breads and cereals. Your health care provider will help you determine the amount of weight gain that is right for you.  Avoid raw meat and uncooked cheese. These carry germs that can cause birth defects in the baby.  Eating four or five small meals rather than three large meals a day may help relieve nausea and vomiting. If you start to feel nauseous, eating a few soda crackers can be helpful. Drinking liquids between meals instead of during meals also seems to help nausea and vomiting.  If you develop constipation, eat more high-fiber foods, such as fresh vegetables or fruit and whole grains. Drink enough fluids to keep your urine clear or pale yellow. Activity and Exercise  Exercise only as directed by your health care provider. Exercising will help you:  Control your weight.  Stay in shape.  Be prepared for labor and delivery.  Experiencing pain or cramping in the lower abdomen or low back is a good sign that you should stop exercising. Check with your health care provider before continuing normal exercises.  Try to avoid standing for long periods of time. Move your legs often if you must stand in one place for a long time.  Avoid heavy lifting.  Wear low-heeled shoes, and practice good posture.  You may continue to have sex unless your health care provider directs you  otherwise. Relief of Pain or Discomfort  Wear a good support bra for breast tenderness.   Take warm sitz baths to soothe any pain or discomfort caused by hemorrhoids. Use hemorrhoid cream if your health care provider approves.   Rest with your legs elevated if you have leg cramps or low back pain.  If you develop varicose veins in your legs, wear support hose. Elevate your feet for 15 minutes, 3-4 times a day. Limit salt in your diet. Prenatal Care  Schedule your prenatal visits by the twelfth week of pregnancy. They are usually scheduled monthly at first, then more often in the last 2 months before delivery.  Write down your questions. Take them to your prenatal visits.  Keep all your prenatal visits as directed by your health care provider. Safety  Wear your seat belt at all times when driving.  Make a list of emergency phone numbers, including numbers for family, friends, the hospital, and police and fire departments. General Tips    Ask your health care provider for a referral to a local prenatal education class. Begin classes no later than at the beginning of month 6 of your pregnancy.  Ask for help if you have counseling or nutritional needs during pregnancy. Your health care provider can offer advice or refer you to specialists for help with various needs.  Do not use hot tubs, steam rooms, or saunas.  Do not douche or use tampons or scented sanitary pads.  Do not cross your legs for long periods of time.  Avoid cat litter boxes and soil used by cats. These carry germs that can cause birth defects in the baby and possibly loss of the fetus by miscarriage or stillbirth.  Avoid all smoking, herbs, alcohol, and medicines not prescribed by your health care provider. Chemicals in these affect the formation and growth of the baby.  Schedule a dentist appointment. At home, brush your teeth with a soft toothbrush and be gentle when you floss. SEEK MEDICAL CARE IF:   You have  dizziness.  You have mild pelvic cramps, pelvic pressure, or nagging pain in the abdominal area.  You have persistent nausea, vomiting, or diarrhea.  You have a bad smelling vaginal discharge.  You have pain with urination.  You notice increased swelling in your face, hands, legs, or ankles. SEEK IMMEDIATE MEDICAL CARE IF:   You have a fever.  You are leaking fluid from your vagina.  You have spotting or bleeding from your vagina.  You have severe abdominal cramping or pain.  You have rapid weight gain or loss.  You vomit blood or material that looks like coffee grounds.  You are exposed to [place] measles and have never had them.  You are exposed to fifth disease or chickenpox.  You develop a severe headache.  You have shortness of breath.  You have any kind of trauma, such as from a fall or a car accident. Document Released: 09/15/2001 Document Revised: 02/05/2014 Document Reviewed: 08/01/2013 [employer] Patient Information 2015 [employer]. This information is not intended to replace advice given to you by your health care provider. Make sure you discuss any questions you have with your health care provider.  

## 2015-01-22 NOTE — Progress Notes (Signed)
Pt denies any problems or concerns at this time.  

## 2015-01-22 NOTE — Progress Notes (Signed)
Low-risk OB appointment G1P0 [redacted]w[redacted]d Estimated Date of Delivery: 08/11/15 BP 118/76 mmHg  Pulse 80  Wt 232 lb (105.235 kg)  LMP 10/25/2014 (Approximate)  BP, weight, and urine reviewed.  Refer to obstetrical flow sheet for FH & FHR.  No fm yet. Denies cramping, lof, vb, or uti s/s. Some fatigue.  Reviewed warning s/s to report. Plan:  Continue routine obstetrical care  F/U in 4wks for OB appointment  Declines genetic screening

## 2015-02-08 ENCOUNTER — Inpatient Hospital Stay (HOSPITAL_COMMUNITY): Payer: Medicaid Other

## 2015-02-08 ENCOUNTER — Inpatient Hospital Stay (HOSPITAL_COMMUNITY)
Admission: AD | Admit: 2015-02-08 | Discharge: 2015-02-08 | Disposition: A | Discharge status: Home / Self Care | Payer: Medicaid Other | Source: Ambulatory Visit | Attending: Obstetrics & Gynecology | Admitting: Obstetrics & Gynecology

## 2015-02-08 ENCOUNTER — Encounter (HOSPITAL_COMMUNITY): Payer: Self-pay

## 2015-02-08 DIAGNOSIS — Z87891 Personal history of nicotine dependence: Secondary | ICD-10-CM | POA: Insufficient documentation

## 2015-02-08 DIAGNOSIS — R58 Hemorrhage, not elsewhere classified: Secondary | ICD-10-CM

## 2015-02-08 DIAGNOSIS — O208 Other hemorrhage in early pregnancy: Secondary | ICD-10-CM | POA: Diagnosis not present

## 2015-02-08 DIAGNOSIS — Z3A13 13 weeks gestation of pregnancy: Secondary | ICD-10-CM | POA: Insufficient documentation

## 2015-02-08 DIAGNOSIS — O209 Hemorrhage in early pregnancy, unspecified: Secondary | ICD-10-CM | POA: Diagnosis present

## 2015-02-08 DIAGNOSIS — O4692 Antepartum hemorrhage, unspecified, second trimester: Secondary | ICD-10-CM

## 2015-02-08 LAB — CBC
HCT: 40.1 % (ref 36.0–46.0)
Hemoglobin: 14.3 g/dL (ref 12.0–15.0)
MCH: 31.8 pg (ref 26.0–34.0)
MCHC: 35.7 g/dL (ref 30.0–36.0)
MCV: 89.1 fL (ref 78.0–100.0)
Platelets: 164 10*3/uL (ref 150–400)
RBC: 4.5 MIL/uL (ref 3.87–5.11)
RDW: 14.2 % (ref 11.5–15.5)
WBC: 13.3 10*3/uL — ABNORMAL HIGH (ref 4.0–10.5)

## 2015-02-08 NOTE — MAU Note (Signed)
Vaginal bleeding started about 10 minutes ago heavy no clots, no cramping.

## 2015-02-08 NOTE — Discharge Instructions (Signed)
Second Trimester of Pregnancy °The second trimester is from week 13 through week 28, months 4 through 6. The second trimester is often a time when you feel your best. Your body has also adjusted to being pregnant, and you begin to feel better physically. Usually, morning sickness has lessened or quit completely, you may have more energy, and you may have an increase in appetite. The second trimester is also a time when the fetus is growing rapidly. At the end of the sixth month, the fetus is about 9 inches long and weighs about 1½ pounds. You will likely begin to feel the baby move (quickening) between 18 and 20 weeks of the pregnancy. °BODY CHANGES °Your body goes through many changes during pregnancy. The changes vary from woman to woman.  °· Your weight will continue to increase. You will notice your lower abdomen bulging out. °· You may begin to get stretch marks on your hips, abdomen, and breasts. °· You may develop headaches that can be relieved by medicines approved by your health care provider. °· You may urinate more often because the fetus is pressing on your bladder. °· You may develop or continue to have heartburn as a result of your pregnancy. °· You may develop constipation because certain hormones are causing the muscles that push waste through your intestines to slow down. °· You may develop hemorrhoids or swollen, bulging veins (varicose veins). °· You may have back pain because of the weight gain and pregnancy hormones relaxing your joints between the bones in your pelvis and as a result of a shift in weight and the muscles that support your balance. °· Your breasts will continue to grow and be tender. °· Your gums may bleed and may be sensitive to brushing and flossing. °· Dark spots or blotches (chloasma, mask of pregnancy) may develop on your face. This will likely fade after the baby is born. °· A dark line from your belly button to the pubic area (linea nigra) may appear. This will likely fade  after the baby is born. °· You may have changes in your hair. These can include thickening of your hair, rapid growth, and changes in texture. Some women also have hair loss during or after pregnancy, or hair that feels dry or thin. Your hair will most likely return to normal after your baby is born. °WHAT TO EXPECT AT YOUR PRENATAL VISITS °During a routine prenatal visit: °· You will be weighed to make sure you and the fetus are growing normally. °· Your blood pressure will be taken. °· Your abdomen will be measured to track your baby's growth. °· The fetal heartbeat will be listened to. °· Any test results from the previous visit will be discussed. °Your health care provider may ask you: °· How you are feeling. °· If you are feeling the baby move. °· If you have had any abnormal symptoms, such as leaking fluid, bleeding, severe headaches, or abdominal cramping. °· If you have any questions. °Other tests that may be performed during your second trimester include: °· Blood tests that check for: °¨ Low iron levels (anemia). °¨ Gestational diabetes (between 24 and 28 weeks). °¨ Rh antibodies. °· Urine tests to check for infections, diabetes, or protein in the urine. °· An ultrasound to confirm the proper growth and development of the baby. °· An amniocentesis to check for possible genetic problems. °· Fetal screens for spina bifida and Down syndrome. °HOME CARE INSTRUCTIONS  °· Avoid all smoking, herbs, alcohol, and unprescribed   drugs. These chemicals affect the formation and growth of the baby.  Follow your health care provider's instructions regarding medicine use. There are medicines that are either safe or unsafe to take during pregnancy.  Exercise only as directed by your health care provider. Experiencing uterine cramps is a good sign to stop exercising.  Continue to eat regular, healthy meals.  Wear a good support bra for breast tenderness.  Do not use hot tubs, steam rooms, or saunas.  Wear your  seat belt at all times when driving.  Avoid raw meat, uncooked cheese, cat litter boxes, and soil used by cats. These carry germs that can cause birth defects in the baby.  Take your prenatal vitamins.  Try taking a stool softener (if your health care provider approves) if you develop constipation. Eat more high-fiber foods, such as fresh vegetables or fruit and whole grains. Drink plenty of fluids to keep your urine clear or pale yellow.  Take warm sitz baths to soothe any pain or discomfort caused by hemorrhoids. Use hemorrhoid cream if your health care provider approves.  If you develop varicose veins, wear support hose. Elevate your feet for 15 minutes, 3-4 times a day. Limit salt in your diet.  Avoid heavy lifting, wear low heel shoes, and practice good posture.  Rest with your legs elevated if you have leg cramps or low back pain.  Visit your dentist if you have not gone yet during your pregnancy. Use a soft toothbrush to brush your teeth and be gentle when you floss.  A sexual relationship may be continued unless your health care provider directs you otherwise.  Continue to go to all your prenatal visits as directed by your health care provider. SEEK MEDICAL CARE IF:   You have dizziness.  You have mild pelvic cramps, pelvic pressure, or nagging pain in the abdominal area.  You have persistent nausea, vomiting, or diarrhea.  You have a bad smelling vaginal discharge.  You have pain with urination. SEEK IMMEDIATE MEDICAL CARE IF:   You have a fever.  You are leaking fluid from your vagina.  You have spotting or bleeding from your vagina.  You have severe abdominal cramping or pain.  You have rapid weight gain or loss.  You have shortness of breath with chest pain.  You notice sudden or extreme swelling of your face, hands, ankles, feet, or legs.  You have not felt your baby move in over an hour.  You have severe headaches that do not go away with  medicine.  You have vision changes. Document Released: 09/15/2001 Document Revised: 09/26/2013 Document Reviewed: 11/22/2012 [employer] Patient Information 2015 [place], [place]. This information is not intended to replace advice given to you by your health care provider. Make sure you discuss any questions you have with your health care provider. Subchorionic Hematoma A subchorionic hematoma is a gathering of blood between the outer wall of the placenta and the inner wall of the womb (uterus). The placenta is the organ that connects the fetus to the wall of the uterus. The placenta performs the feeding, breathing (oxygen to the fetus), and waste removal (excretory work) of the fetus.  Subchorionic hematoma is the most common abnormality found on a result from ultrasonography done during the first trimester or early second trimester of pregnancy. If there has been little or no vaginal bleeding, early small hematomas usually shrink on their own and do not affect your baby or pregnancy. The blood is gradually absorbed over 1-2 weeks. When bleeding  starts later in pregnancy or the hematoma is larger or occurs in an older pregnant woman, the outcome may not be as good. Larger hematomas may get bigger, which increases the chances for miscarriage. Subchorionic hematoma also increases the risk of premature detachment of the placenta from the uterus, preterm (premature) labor, and stillbirth. HOME CARE INSTRUCTIONS  Stay on bed rest if your health care provider recommends this. Although bed rest will not prevent more bleeding or prevent a miscarriage, your health care provider may recommend bed rest until you are advised otherwise.  Avoid heavy lifting (more than 10 lb [4.5 kg]), exercise, sexual intercourse, or douching as directed by your health care provider.  Keep track of the number of pads you use each day and how soaked (saturated) they are. Write down this information.  Do not use tampons.  Keep all  follow-up appointments as directed by your health care provider. Your health care provider may ask you to have follow-up blood tests or ultrasound tests or both. SEEK IMMEDIATE MEDICAL CARE IF:  You have severe cramps in your stomach, back, abdomen, or pelvis.  You have a fever.  You pass large clots or tissue. Save any tissue for your health care provider to look at.  Your bleeding increases or you become lightheaded, feel weak, or have fainting episodes. Document Released: 01/06/2007 Document Revised: 02/05/2014 Document Reviewed: 04/20/2013 [facility] Patient Information 2015 [place], [place]. This information is not intended to replace advice given to you by your health care provider. Make sure you discuss any questions you have with your health care provider.

## 2015-02-08 NOTE — MAU Provider Note (Signed)
History     CSN: [id number]  Arrival date and time: 02/08/15 1141   First Provider Initiated Contact with Patient 02/08/15 1157      Chief Complaint  Patient presents with  . Vaginal Bleeding   Vaginal Bleeding The patient's primary symptoms include vaginal bleeding. This is a new problem. The current episode started today. The problem occurs constantly. The problem has been gradually improving. She is pregnant. The vaginal bleeding is heavier than menses. She has not been passing clots. She has not been passing tissue. Nothing aggravates the symptoms. She has tried nothing for the symptoms.    24 y.o. G1P0 @[redacted]w[redacted]d  presents to the MAU states that Vaginal bleeding started about 10 minutes ago heavy no clots, no cramping.   Past Medical History  Diagnosis Date  . Asthma   . UTI (lower urinary tract infection)   . Pregnant 12/10/2014    Past Surgical History  Procedure Laterality Date  . No past surgeries      No family history on file.  History  Substance Use Topics  . Smoking status: Former Smoker -- 0.50 packs/day for 5 years    Types: Cigarettes  . Smokeless tobacco: Never Used  . Alcohol Use: No    Allergies:  Allergies  Allergen Reactions  . Penicillins Hives    Prescriptions prior to admission  Medication Sig Dispense Refill Last Dose  . acetaminophen (TYLENOL) 500 MG tablet Take 1,000 mg by mouth every 6 (six) hours as needed for headache.   02/08/2015 at Unknown time  . Prenatal Vit-Fe Fumarate-FA (PRENATAL VITAMIN PO) Take by mouth daily.   02/08/2015 at Unknown time    Review of Systems  Genitourinary: Positive for vaginal bleeding.       Vaginal bleeding  All other systems reviewed and are negative.  Physical Exam   Blood pressure 159/91, pulse 96, temperature 97.8 F (36.6 C), resp. rate 18, height 5\' 3"  (1.6 m), weight 105.235 kg (232 lb), last menstrual period 10/25/2014.  Physical Exam  Nursing note and vitals reviewed. Constitutional: She is  oriented to person, place, and time. She appears well-developed and well-nourished. No distress.  HENT:  Head: Normocephalic and atraumatic.  Neck: Normal range of motion.  Cardiovascular: Normal rate.   Respiratory: Effort normal. No respiratory distress.  GI: Soft. She exhibits no distension and no mass. There is no tenderness. There is no rebound and no guarding.  Musculoskeletal: Normal range of motion.  Neurological: She is alert and oriented to person, place, and time.  Skin: Skin is warm and dry.  Psychiatric: She has a normal mood and affect. Her behavior is normal. Judgment and thought content normal.   Results for orders placed or performed during the hospital encounter of 02/08/15 (from the past 24 hour(s))  CBC     Status: Abnormal   Collection Time: 02/08/15 12:11 PM  Result Value Ref Range   WBC 13.3 (H) 4.0 - 10.5 K/uL   RBC 4.50 3.87 - 5.11 MIL/uL   Hemoglobin 14.3 12.0 - 15.0 g/dL   HCT 40.1 36.0 - 46.0 %   MCV 89.1 78.0 - 100.0 fL   MCH 31.8 26.0 - 34.0 pg   MCHC 35.7 30.0 - 36.0 g/dL   RDW 14.2 11.5 - 15.5 %   Platelets 164 150 - 400 K/uL  CLINICAL DATA: Pregnant, vaginal bleeding  EXAM: OBSTETRIC <14 WK ULTRASOUND  TECHNIQUE: Transabdominal ultrasound was performed for evaluation of the gestation as well as the maternal uterus and adnexal  regions.  COMPARISON: [facility] ultrasound dated 12/17/2014  FINDINGS: Intrauterine gestational sac: Visualized/normal in shape.  Yolk sac: Not visualized  Embryo: Present  Cardiac Activity: Present  Heart Rate: 152 bpm  CRL: 75.6 mm 13 w 5 d [place] EDC: 08/11/2015  Maternal uterus/adnexae: Small subchronic hemorrhage.  Two uterine fibroids measuring up to 4.2 cm.  Right ovary is within normal limits. Left ovary is not discretely visualized.  No free fluid.  IMPRESSION: Single live intrauterine gestation with estimated gestational age [redacted] weeks 5 days by  crown-rump length.   Electronically Signed  By: Julian Hy M.D.  On: 02/08/2015 13:24  MAU Course  Procedures  MDM   Assessment and Plan  Vaginal bleeding in pregnancy Subchorionic Hemmorhage  Keep next regular scheduled appt at clinic Bleeding precautions reveiwed Return to MAU prn   Clemmons,Lori Grissett 02/08/2015, 1:45 PM

## 2015-02-11 ENCOUNTER — Telehealth: Payer: Self-pay | Admitting: Obstetrics and Gynecology

## 2015-02-11 NOTE — Telephone Encounter (Signed)
Spoke with pt. Pt started bleeding Friday. She went to [facility] hospital. She has a subchronic hematoma. She is not passing clots today and no cramping today. She is feeling better now. She has a scheduled appt on 02/19/15. I advised to keep her appt for the 17th. Advised if cramping or bleeding returned, call us back. Pt voiced understanding. [place]

## 2015-02-19 ENCOUNTER — Encounter: Payer: Self-pay | Admitting: Women's Health

## 2015-02-19 ENCOUNTER — Ambulatory Visit (INDEPENDENT_AMBULATORY_CARE_PROVIDER_SITE_OTHER): Payer: Medicaid Other | Admitting: Women's Health

## 2015-02-19 VITALS — BP 108/64 | HR 96 | Wt 231.0 lb

## 2015-02-19 DIAGNOSIS — O418X90 Other specified disorders of amniotic fluid and membranes, unspecified trimester, not applicable or unspecified: Secondary | ICD-10-CM | POA: Insufficient documentation

## 2015-02-19 DIAGNOSIS — O3410 Maternal care for benign tumor of corpus uteri, unspecified trimester: Secondary | ICD-10-CM

## 2015-02-19 DIAGNOSIS — Z363 Encounter for antenatal screening for malformations: Secondary | ICD-10-CM

## 2015-02-19 DIAGNOSIS — Z1389 Encounter for screening for other disorder: Secondary | ICD-10-CM

## 2015-02-19 DIAGNOSIS — O3411 Maternal care for benign tumor of corpus uteri, first trimester: Secondary | ICD-10-CM

## 2015-02-19 DIAGNOSIS — O4590 Premature separation of placenta, unspecified, unspecified trimester: Secondary | ICD-10-CM

## 2015-02-19 DIAGNOSIS — O468X9 Other antepartum hemorrhage, unspecified trimester: Secondary | ICD-10-CM

## 2015-02-19 DIAGNOSIS — Z331 Pregnant state, incidental: Secondary | ICD-10-CM

## 2015-02-19 DIAGNOSIS — Z86018 Personal history of other benign neoplasm: Secondary | ICD-10-CM | POA: Insufficient documentation

## 2015-02-19 DIAGNOSIS — D259 Leiomyoma of uterus, unspecified: Secondary | ICD-10-CM

## 2015-02-19 DIAGNOSIS — Z3402 Encounter for supervision of normal first pregnancy, second trimester: Secondary | ICD-10-CM

## 2015-02-19 LAB — POCT URINALYSIS DIPSTICK
Glucose, UA: NEGATIVE
KETONES UA: NEGATIVE
LEUKOCYTES UA: NEGATIVE
Nitrite, UA: NEGATIVE
Protein, UA: NEGATIVE
RBC UA: NEGATIVE

## 2015-02-19 NOTE — Patient Instructions (Signed)
Second Trimester of Pregnancy The second trimester is from week 13 through week 28, months 4 through 6. The second trimester is often a time when you feel your best. Your body has also adjusted to being pregnant, and you begin to feel better physically. Usually, morning sickness has lessened or quit completely, you may have more energy, and you may have an increase in appetite. The second trimester is also a time when the fetus is growing rapidly. At the end of the sixth month, the fetus is about 9 inches long and weighs about 1 pounds. You will likely begin to feel the baby move (quickening) between 18 and 20 weeks of the pregnancy. BODY CHANGES Your body goes through many changes during pregnancy. The changes vary from woman to woman.   Your weight will continue to increase. You will notice your lower abdomen bulging out.  You may begin to get stretch marks on your hips, abdomen, and breasts.  You may develop headaches that can be relieved by medicines approved by your health care provider.  You may urinate more often because the fetus is pressing on your bladder.  You may develop or continue to have heartburn as a result of your pregnancy.  You may develop constipation because certain hormones are causing the muscles that push waste through your intestines to slow down.  You may develop hemorrhoids or swollen, bulging veins (varicose veins).  You may have back pain because of the weight gain and pregnancy hormones relaxing your joints between the bones in your pelvis and as a result of a shift in weight and the muscles that support your balance.  Your breasts will continue to grow and be tender.  Your gums may bleed and may be sensitive to brushing and flossing.  Dark spots or blotches (chloasma, mask of pregnancy) may develop on your face. This will likely fade after the baby is born.  A dark line from your belly button to the pubic area (linea nigra) may appear. This will likely fade  after the baby is born.  You may have changes in your hair. These can include thickening of your hair, rapid growth, and changes in texture. Some women also have hair loss during or after pregnancy, or hair that feels dry or thin. Your hair will most likely return to normal after your baby is born. WHAT TO EXPECT AT YOUR PRENATAL VISITS During a routine prenatal visit:  You will be weighed to make sure you and the fetus are growing normally.  Your blood pressure will be taken.  Your abdomen will be measured to track your baby's growth.  The fetal heartbeat will be listened to.  Any test results from the previous visit will be discussed. Your health care provider may ask you:  How you are feeling.  If you are feeling the baby move.  If you have had any abnormal symptoms, such as leaking fluid, bleeding, severe headaches, or abdominal cramping.  If you have any questions. Other tests that may be performed during your second trimester include:  Blood tests that check for:  Low iron levels (anemia).  Gestational diabetes (between 24 and 28 weeks).  Rh antibodies.  Urine tests to check for infections, diabetes, or protein in the urine.  An ultrasound to confirm the proper growth and development of the baby.  An amniocentesis to check for possible genetic problems.  Fetal screens for spina bifida and Down syndrome. HOME CARE INSTRUCTIONS   Avoid all smoking, herbs, alcohol, and unprescribed   drugs. These chemicals affect the formation and growth of the baby.  Follow your health care provider's instructions regarding medicine use. There are medicines that are either safe or unsafe to take during pregnancy.  Exercise only as directed by your health care provider. Experiencing uterine cramps is a good sign to stop exercising.  Continue to eat regular, healthy meals.  Wear a good support bra for breast tenderness.  Do not use hot tubs, steam rooms, or saunas.  Wear your  seat belt at all times when driving.  Avoid raw meat, uncooked cheese, cat litter boxes, and soil used by cats. These carry germs that can cause birth defects in the baby.  Take your prenatal vitamins.  Try taking a stool softener (if your health care provider approves) if you develop constipation. Eat more high-fiber foods, such as fresh vegetables or fruit and whole grains. Drink plenty of fluids to keep your urine clear or pale yellow.  Take warm sitz baths to soothe any pain or discomfort caused by hemorrhoids. Use hemorrhoid cream if your health care provider approves.  If you develop varicose veins, wear support hose. Elevate your feet for 15 minutes, 3-4 times a day. Limit salt in your diet.  Avoid heavy lifting, wear low heel shoes, and practice good posture.  Rest with your legs elevated if you have leg cramps or low back pain.  Visit your dentist if you have not gone yet during your pregnancy. Use a soft toothbrush to brush your teeth and be gentle when you floss.  A sexual relationship may be continued unless your health care provider directs you otherwise.  Continue to go to all your prenatal visits as directed by your health care provider. SEEK MEDICAL CARE IF:   You have dizziness.  You have mild pelvic cramps, pelvic pressure, or nagging pain in the abdominal area.  You have persistent nausea, vomiting, or diarrhea.  You have a bad smelling vaginal discharge.  You have pain with urination. SEEK IMMEDIATE MEDICAL CARE IF:   You have a fever.  You are leaking fluid from your vagina.  You have spotting or bleeding from your vagina.  You have severe abdominal cramping or pain.  You have rapid weight gain or loss.  You have shortness of breath with chest pain.  You notice sudden or extreme swelling of your face, hands, ankles, feet, or legs.  You have not felt your baby move in over an hour.  You have severe headaches that do not go away with  medicine.  You have vision changes. Document Released: 09/15/2001 Document Revised: 09/26/2013 Document Reviewed: 11/22/2012 [facility] Patient Information 2015 [place], [place]. This information is not intended to replace advice given to you by your health care provider. Make sure you discuss any questions you have with your health care provider.  Subchorionic Hematoma A subchorionic hematoma is a gathering of blood between the outer wall of the placenta and the inner wall of the womb (uterus). The placenta is the organ that connects the fetus to the wall of the uterus. The placenta performs the feeding, breathing (oxygen to the fetus), and waste removal (excretory work) of the fetus.  Subchorionic hematoma is the most common abnormality found on a result from ultrasonography done during the first trimester or early second trimester of pregnancy. If there has been little or no vaginal bleeding, early small hematomas usually shrink on their own and do not affect your baby or pregnancy. The blood is gradually absorbed over 1-2 weeks. When  bleeding starts later in pregnancy or the hematoma is larger or occurs in an older pregnant woman, the outcome may not be as good. Larger hematomas may get bigger, which increases the chances for miscarriage. Subchorionic hematoma also increases the risk of premature detachment of the placenta from the uterus, preterm (premature) labor, and stillbirth. HOME CARE INSTRUCTIONS  Stay on bed rest if your health care provider recommends this. Although bed rest will not prevent more bleeding or prevent a miscarriage, your health care provider may recommend bed rest until you are advised otherwise.  Avoid heavy lifting (more than 10 lb [4.5 kg]), exercise, sexual intercourse, or douching as directed by your health care provider.  Keep track of the number of pads you use each day and how soaked (saturated) they are. Write down this information.  Do not use tampons.  Keep all  follow-up appointments as directed by your health care provider. Your health care provider may ask you to have follow-up blood tests or ultrasound tests or both. SEEK IMMEDIATE MEDICAL CARE IF:  You have severe cramps in your stomach, back, abdomen, or pelvis.  You have a fever.  You pass large clots or tissue. Save any tissue for your health care provider to look at.  Your bleeding increases or you become lightheaded, feel weak, or have fainting episodes. Document Released: 01/06/2007 Document Revised: 02/05/2014 Document Reviewed: 04/20/2013 [facility] Patient Information 2015 [place], [place]. This information is not intended to replace advice given to you by your health care provider. Make sure you discuss any questions you have with your health care provider.

## 2015-02-19 NOTE — Progress Notes (Signed)
Low-risk OB appointment G1P0 [redacted]w[redacted]d Estimated Date of Delivery: 08/11/15 BP 108/64 mmHg  Pulse 96  Wt 231 lb (104.781 kg)  LMP 10/25/2014 (Approximate)  BP, weight, and urine reviewed.  Refer to obstetrical flow sheet for FH & FHR.  No fm yet. Denies cramping, lof, or uti s/s. Went to [facility] 5/6 w/ heavy vb, u/s revealed small [facility] & 2 small fibroids. No vb since.  Reviewed warning s/s to report. Plan:  Continue routine obstetrical care  F/U in 4wks for OB appointment & anatomy u/s Declines genetic screening

## 2015-03-19 ENCOUNTER — Ambulatory Visit (INDEPENDENT_AMBULATORY_CARE_PROVIDER_SITE_OTHER): Payer: Medicaid Other

## 2015-03-19 ENCOUNTER — Encounter: Payer: Self-pay | Admitting: Women's Health

## 2015-03-19 ENCOUNTER — Ambulatory Visit (INDEPENDENT_AMBULATORY_CARE_PROVIDER_SITE_OTHER): Payer: Medicaid Other | Admitting: Women's Health

## 2015-03-19 VITALS — BP 118/74 | HR 68 | Wt 226.0 lb

## 2015-03-19 DIAGNOSIS — O99212 Obesity complicating pregnancy, second trimester: Secondary | ICD-10-CM

## 2015-03-19 DIAGNOSIS — Z3402 Encounter for supervision of normal first pregnancy, second trimester: Secondary | ICD-10-CM

## 2015-03-19 DIAGNOSIS — O3412 Maternal care for benign tumor of corpus uteri, second trimester: Secondary | ICD-10-CM | POA: Diagnosis not present

## 2015-03-19 DIAGNOSIS — O3410 Maternal care for benign tumor of corpus uteri, unspecified trimester: Secondary | ICD-10-CM

## 2015-03-19 DIAGNOSIS — O4590 Premature separation of placenta, unspecified, unspecified trimester: Secondary | ICD-10-CM

## 2015-03-19 DIAGNOSIS — O468X9 Other antepartum hemorrhage, unspecified trimester: Secondary | ICD-10-CM

## 2015-03-19 DIAGNOSIS — D259 Leiomyoma of uterus, unspecified: Secondary | ICD-10-CM

## 2015-03-19 DIAGNOSIS — Z1389 Encounter for screening for other disorder: Secondary | ICD-10-CM

## 2015-03-19 DIAGNOSIS — O418X90 Other specified disorders of amniotic fluid and membranes, unspecified trimester, not applicable or unspecified: Secondary | ICD-10-CM

## 2015-03-19 DIAGNOSIS — Z363 Encounter for antenatal screening for malformations: Secondary | ICD-10-CM

## 2015-03-19 DIAGNOSIS — Z331 Pregnant state, incidental: Secondary | ICD-10-CM

## 2015-03-19 LAB — POCT URINALYSIS DIPSTICK
Blood, UA: NEGATIVE
Glucose, UA: NEGATIVE
KETONES UA: NEGATIVE
LEUKOCYTES UA: NEGATIVE
Nitrite, UA: NEGATIVE
Protein, UA: NEGATIVE

## 2015-03-19 NOTE — Progress Notes (Signed)
[place] 19+2wks measurements c/w dates,afi sdp 4.56cm,ant pl gr 0, breech,fht 165bpm,cx 4.3cm, anatomy complete w/no obvious abn seen

## 2015-03-19 NOTE — Patient Instructions (Signed)
[place] Pediatricians/Family Doctors:  [facility] [phone]                 [place] [phone] (usually not accepting new patients unless you have family there already, you are always welcome to call and ask)            [facility] ([place]) [phone]   [facility] Pediatricians/Family Doctors:   [place]: [phone]  [facility]: [phone]   Second Trimester of Pregnancy The second trimester is from week 13 through week 28, months 4 through 6. The second trimester is often a time when you feel your best. Your body has also adjusted to being pregnant, and you begin to feel better physically. Usually, morning sickness has lessened or quit completely, you may have more energy, and you may have an increase in appetite. The second trimester is also a time when the fetus is growing rapidly. At the end of the sixth month, the fetus is about 9 inches long and weighs about 1 pounds. You will likely begin to feel the baby move (quickening) between 18 and 20 weeks of the pregnancy. BODY CHANGES Your body goes through many changes during pregnancy. The changes vary from woman to woman.   Your weight will continue to increase. You will notice your lower abdomen bulging out.  You may begin to get stretch marks on your hips, abdomen, and breasts.  You may develop headaches that can be relieved by medicines approved by your health care provider.  You may urinate more often because the fetus is pressing on your bladder.  You may develop or continue to have heartburn as a result of your pregnancy.  You may develop constipation because certain hormones are causing the muscles that push waste through your intestines to slow down.  You may develop hemorrhoids or swollen, bulging veins (varicose veins).  You may have back pain because of the weight  gain and pregnancy hormones relaxing your joints between the bones in your pelvis and as a result of a shift in weight and the muscles that support your balance.  Your breasts will continue to grow and be tender.  Your gums may bleed and may be sensitive to brushing and flossing.  Dark spots or blotches (chloasma, mask of pregnancy) may develop on your face. This will likely fade after the baby is born.  A dark line from your belly button to the pubic area (linea nigra) may appear. This will likely fade after the baby is born.  You may have changes in your hair. These can include thickening of your hair, rapid growth, and changes in texture. Some women also have hair loss during or after pregnancy, or hair that feels dry or thin. Your hair will most likely return to normal after your baby is born. WHAT TO EXPECT AT YOUR PRENATAL VISITS During a routine prenatal visit:  You will be weighed to make sure you and the fetus are growing normally.  Your blood pressure will be taken.  Your abdomen will be measured to track your baby's growth.  The fetal heartbeat will be listened to.  Any test results from the previous visit will be discussed. Your health care provider may ask you:  How you are feeling.  If you are feeling the baby move.  If you have had any abnormal symptoms, such as leaking fluid, bleeding, severe  headaches, or abdominal cramping.  If you have any questions. Other tests that may be performed during your second trimester include:  Blood tests that check for:  Low iron levels (anemia).  Gestational diabetes (between 24 and 28 weeks).  Rh antibodies.  Urine tests to check for infections, diabetes, or protein in the urine.  An ultrasound to confirm the proper growth and development of the baby.  An amniocentesis to check for possible genetic problems.  Fetal screens for spina bifida and Down syndrome. HOME CARE INSTRUCTIONS   Avoid all smoking, herbs,  alcohol, and unprescribed drugs. These chemicals affect the formation and growth of the baby.  Follow your health care provider's instructions regarding medicine use. There are medicines that are either safe or unsafe to take during pregnancy.  Exercise only as directed by your health care provider. Experiencing uterine cramps is a good sign to stop exercising.  Continue to eat regular, healthy meals.  Wear a good support bra for breast tenderness.  Do not use hot tubs, steam rooms, or saunas.  Wear your seat belt at all times when driving.  Avoid raw meat, uncooked cheese, cat litter boxes, and soil used by cats. These carry germs that can cause birth defects in the baby.  Take your prenatal vitamins.  Try taking a stool softener (if your health care provider approves) if you develop constipation. Eat more high-fiber foods, such as fresh vegetables or fruit and whole grains. Drink plenty of fluids to keep your urine clear or pale yellow.  Take warm sitz baths to soothe any pain or discomfort caused by hemorrhoids. Use hemorrhoid cream if your health care provider approves.  If you develop varicose veins, wear support hose. Elevate your feet for 15 minutes, 3-4 times a day. Limit salt in your diet.  Avoid heavy lifting, wear low heel shoes, and practice good posture.  Rest with your legs elevated if you have leg cramps or low back pain.  Visit your dentist if you have not gone yet during your pregnancy. Use a soft toothbrush to brush your teeth and be gentle when you floss.  A sexual relationship may be continued unless your health care provider directs you otherwise.  Continue to go to all your prenatal visits as directed by your health care provider. SEEK MEDICAL CARE IF:   You have dizziness.  You have mild pelvic cramps, pelvic pressure, or nagging pain in the abdominal area.  You have persistent nausea, vomiting, or diarrhea.  You have a bad smelling vaginal  discharge.  You have pain with urination. SEEK IMMEDIATE MEDICAL CARE IF:   You have a fever.  You are leaking fluid from your vagina.  You have spotting or bleeding from your vagina.  You have severe abdominal cramping or pain.  You have rapid weight gain or loss.  You have shortness of breath with chest pain.  You notice sudden or extreme swelling of your face, hands, ankles, feet, or legs.  You have not felt your baby move in over an hour.  You have severe headaches that do not go away with medicine.  You have vision changes. Document Released: 09/15/2001 Document Revised: 09/26/2013 Document Reviewed: 11/22/2012 [facility] Patient Information 2015 [place], [place]. This information is not intended to replace advice given to you by your health care provider. Make sure you discuss any questions you have with your health care provider.

## 2015-03-19 NOTE — Progress Notes (Signed)
Low-risk OB appointment G1P0 [redacted]w[redacted]d Estimated Date of Delivery: 08/11/15 BP 118/74 mmHg  Pulse 68  Wt 226 lb (102.513 kg)  LMP 10/25/2014 (Approximate)  BP, weight, and urine reviewed.  Refer to obstetrical flow sheet for FH & FHR.  Reports good fm.  Denies regular uc's, lof, vb, or uti s/s. No complaints. Reviewed today's normal anatomy u/s, ptl s/s. Plan:  Continue routine obstetrical care  F/U in 4wks for OB appointment

## 2015-04-16 ENCOUNTER — Ambulatory Visit (INDEPENDENT_AMBULATORY_CARE_PROVIDER_SITE_OTHER): Payer: Medicaid Other | Admitting: Women's Health

## 2015-04-16 ENCOUNTER — Encounter: Payer: Medicaid Other | Admitting: Women's Health

## 2015-04-16 VITALS — BP 120/80 | HR 79 | Wt 235.0 lb

## 2015-04-16 DIAGNOSIS — Z1389 Encounter for screening for other disorder: Secondary | ICD-10-CM

## 2015-04-16 DIAGNOSIS — Z3402 Encounter for supervision of normal first pregnancy, second trimester: Secondary | ICD-10-CM

## 2015-04-16 DIAGNOSIS — Z331 Pregnant state, incidental: Secondary | ICD-10-CM

## 2015-04-16 LAB — POCT URINALYSIS DIPSTICK
Blood, UA: NEGATIVE
GLUCOSE UA: NEGATIVE
Ketones, UA: NEGATIVE
Leukocytes, UA: NEGATIVE
Nitrite, UA: NEGATIVE

## 2015-04-16 NOTE — Progress Notes (Signed)
Low-risk OB appointment G1P0 [redacted]w[redacted]d Estimated Date of Delivery: 08/11/15 BP 140/88 mmHg  Pulse 79  Wt 235 lb (106.595 kg)  LMP 10/25/2014 (Approximate)  BP recheck 120/80 BP, weight, and urine reviewed.  Refer to obstetrical flow sheet for FH & FHR.  Reports good fm.  Denies regular uc's, lof, vb, or uti s/s. Rt fingers go numb in am's. +Phalen's, to try wrist splint. Denies ha, scotomata, ruq/epigastric pain, n/v.   DTRs 2+, no edema, no clonus Reviewed ptl s/s, fm, pre-e warning s/s. Plan:  Continue routine obstetrical care  F/U in 4wks for OB appointment and pn2

## 2015-04-16 NOTE — Patient Instructions (Signed)
You will have your sugar test next visit.  Please do not eat or drink anything after midnight the night before you come, not even water.  You will be here for at least two hours.     Call the office [phone]) or go to [facility] if:  You begin to have strong, frequent contractions  Your water breaks.  Sometimes it is a big gush of fluid, sometimes it is just a trickle that keeps getting your panties wet or running down your legs  You have vaginal bleeding.  It is normal to have a small amount of spotting if your cervix was checked.   You don't feel your baby moving like normal.  If you don't, get you something to eat and drink and lay down and focus on feeling your baby move.   If your baby is still not moving like normal, you should call the office or go to [facility].  Call the office [phone]) or go to [facility] for these signs of pre-eclampsia:  Severe headache that does not go away with Tylenol  Visual changes- seeing spots, double, blurred vision  Pain under your right breast or upper abdomen that does not go away with Tums or heartburn medicine  Nausea and/or vomiting  Severe swelling in your hands, feet, and face     Second Trimester of Pregnancy The second trimester is from week 13 through week 28, months 4 through 6. The second trimester is often a time when you feel your best. Your body has also adjusted to being pregnant, and you begin to feel better physically. Usually, morning sickness has lessened or quit completely, you may have more energy, and you may have an increase in appetite. The second trimester is also a time when the fetus is growing rapidly. At the end of the sixth month, the fetus is about 9 inches long and weighs about 1 pounds. You will likely begin to feel the baby move (quickening) between 18 and 20 weeks of the pregnancy. BODY CHANGES Your body goes through many changes during pregnancy. The changes vary from woman to woman.   10. Your weight will continue to increase. You will notice your lower abdomen bulging out. 11. You may begin to get stretch marks on your hips, abdomen, and breasts. 12. You may develop headaches that can be relieved by medicines approved by your health care provider. 13. You may urinate more often because the fetus is pressing on your bladder. 14. You may develop or continue to have heartburn as a result of your pregnancy. 15. You may develop constipation because certain hormones are causing the muscles that push waste through your intestines to slow down. 16. You may develop hemorrhoids or swollen, bulging veins (varicose veins). 17. You may have back pain because of the weight gain and pregnancy hormones relaxing your joints between the bones in your pelvis and as a result of a shift in weight and the muscles that support your balance. 18. Your breasts will continue to grow and be tender. 19. Your gums may bleed and may be sensitive to brushing and flossing. 20. Dark spots or blotches (chloasma, mask of pregnancy) may develop on your face. This will likely fade after the baby is born. 21. A dark line from your belly button to the pubic area (linea nigra) may appear. This will likely fade after the baby is born. 22. You may have changes in your hair. These can include thickening of your hair, rapid growth, and  changes in texture. Some women also have hair loss during or after pregnancy, or hair that feels dry or thin. Your hair will most likely return to normal after your baby is born. WHAT TO EXPECT AT YOUR PRENATAL VISITS During a routine prenatal visit:  You will be weighed to make sure you and the fetus are growing normally.  Your blood pressure will be taken.  Your abdomen will be measured to track your baby's growth.  The fetal heartbeat will be listened to.  Any test results from the previous visit will be discussed. Your health care provider may ask you:  How you are  feeling.  If you are feeling the baby move.  If you have had any abnormal symptoms, such as leaking fluid, bleeding, severe headaches, or abdominal cramping.  If you have any questions. Other tests that may be performed during your second trimester include:  Blood tests that check for:  Low iron levels (anemia).  Gestational diabetes (between 24 and 28 weeks).  Rh antibodies.  Urine tests to check for infections, diabetes, or protein in the urine.  An ultrasound to confirm the proper growth and development of the baby.  An amniocentesis to check for possible genetic problems.  Fetal screens for spina bifida and Down syndrome. HOME CARE INSTRUCTIONS   Avoid all smoking, herbs, alcohol, and unprescribed drugs. These chemicals affect the formation and growth of the baby.  Follow your health care provider's instructions regarding medicine use. There are medicines that are either safe or unsafe to take during pregnancy.  Exercise only as directed by your health care provider. Experiencing uterine cramps is a good sign to stop exercising.  Continue to eat regular, healthy meals.  Wear a good support bra for breast tenderness.  Do not use hot tubs, steam rooms, or saunas.  Wear your seat belt at all times when driving.  Avoid raw meat, uncooked cheese, cat litter boxes, and soil used by cats. These carry germs that can cause birth defects in the baby.  Take your prenatal vitamins.  Try taking a stool softener (if your health care provider approves) if you develop constipation. Eat more high-fiber foods, such as fresh vegetables or fruit and whole grains. Drink plenty of fluids to keep your urine clear or pale yellow.  Take warm sitz baths to soothe any pain or discomfort caused by hemorrhoids. Use hemorrhoid cream if your health care provider approves.  If you develop varicose veins, wear support hose. Elevate your feet for 15 minutes, 3-4 times a day. Limit salt in your  diet.  Avoid heavy lifting, wear low heel shoes, and practice good posture.  Rest with your legs elevated if you have leg cramps or low back pain.  Visit your dentist if you have not gone yet during your pregnancy. Use a soft toothbrush to brush your teeth and be gentle when you floss.  A sexual relationship may be continued unless your health care provider directs you otherwise.  Continue to go to all your prenatal visits as directed by your health care provider. SEEK MEDICAL CARE IF:   You have dizziness.  You have mild pelvic cramps, pelvic pressure, or nagging pain in the abdominal area.  You have persistent nausea, vomiting, or diarrhea.  You have a bad smelling vaginal discharge.  You have pain with urination. SEEK IMMEDIATE MEDICAL CARE IF:   You have a fever.  You are leaking fluid from your vagina.  You have spotting or bleeding from your vagina.  You have severe abdominal cramping or pain.  You have rapid weight gain or loss.  You have shortness of breath with chest pain.  You notice sudden or extreme swelling of your face, hands, ankles, feet, or legs.  You have not felt your baby move in over an hour.  You have severe headaches that do not go away with medicine.  You have vision changes. Document Released: 09/15/2001 Document Revised: 09/26/2013 Document Reviewed: 11/22/2012 [facility] Patient Information 2015 [place], [place]. This information is not intended to replace advice given to you by your health care provider. Make sure you discuss any questions you have with your health care provider.

## 2015-04-18 ENCOUNTER — Telehealth: Payer: Self-pay | Admitting: *Deleted

## 2015-04-18 NOTE — Telephone Encounter (Signed)
Pt states was seen this week by Knute Neu, CNM b/p some elevated at that appt, pt advised to monitor b/p at home. Pt states B/P 150/104, does she need to be seen. Per Nigel Berthold, CNM pt to continue to monitor her B/P today and call our office back around 3:30 to see the trend and will decide whether pt needs to be seen tomorrow. Pt verbalized understanding. Pt blood pressure at [facility] within normal.

## 2015-04-18 NOTE — Telephone Encounter (Signed)
Pt states calling to give B/P readings for today 12 pm 140/100, 165/104, 128/83, 140/100, 140/100. Pt states she was at work and felt like she was having a panic attack at that time checked her B/P was 165/104. Pt was given an appt tomorrow with Dr. Glo Herring for evaluation of blood pressure. Informed pt can give her a note to excuse her from work for today.

## 2015-04-19 ENCOUNTER — Ambulatory Visit (INDEPENDENT_AMBULATORY_CARE_PROVIDER_SITE_OTHER): Payer: Medicaid Other | Admitting: Obstetrics & Gynecology

## 2015-04-19 ENCOUNTER — Encounter: Payer: Self-pay | Admitting: Obstetrics & Gynecology

## 2015-04-19 VITALS — BP 120/68 | HR 104 | Wt 233.5 lb

## 2015-04-19 DIAGNOSIS — Z331 Pregnant state, incidental: Secondary | ICD-10-CM

## 2015-04-19 DIAGNOSIS — Z1389 Encounter for screening for other disorder: Secondary | ICD-10-CM

## 2015-04-19 DIAGNOSIS — Z3402 Encounter for supervision of normal first pregnancy, second trimester: Secondary | ICD-10-CM

## 2015-04-19 LAB — POCT URINALYSIS DIPSTICK
Glucose, UA: NEGATIVE
Ketones, UA: NEGATIVE
Nitrite, UA: NEGATIVE
Protein, UA: NEGATIVE

## 2015-04-29 NOTE — Progress Notes (Signed)
Work in [employer]  Gotten some elevated blood pressures with another cuff unsure of  Calibration Head wildly varying numbers based on which cuff she used Here today no issues with a blood pressure Reassured Talked of her gestational hypertension and chronic hypertension Certainly her blood pressure is not running away with this but she has had some borderline blood pressures on occasion Keep appointment as previously scheduled Recommend she continue to use I calibrated cuffs with our cuffs     Face to face time:  10 minutes  Greater than 50% of the visit time was spent in counseling and coordination of care with the patient.  The summary and outline of the counseling and care coordination is summarized in the note above.   All questions were answered.

## 2015-05-14 ENCOUNTER — Ambulatory Visit (INDEPENDENT_AMBULATORY_CARE_PROVIDER_SITE_OTHER): Payer: Medicaid Other | Admitting: Women's Health

## 2015-05-14 ENCOUNTER — Other Ambulatory Visit: Payer: Medicaid Other

## 2015-05-14 ENCOUNTER — Encounter: Payer: Self-pay | Admitting: Women's Health

## 2015-05-14 VITALS — BP 128/76 | HR 88 | Wt 236.0 lb

## 2015-05-14 DIAGNOSIS — Z369 Encounter for antenatal screening, unspecified: Secondary | ICD-10-CM

## 2015-05-14 DIAGNOSIS — Z131 Encounter for screening for diabetes mellitus: Secondary | ICD-10-CM

## 2015-05-14 DIAGNOSIS — Z3402 Encounter for supervision of normal first pregnancy, second trimester: Secondary | ICD-10-CM

## 2015-05-14 DIAGNOSIS — F1290 Cannabis use, unspecified, uncomplicated: Secondary | ICD-10-CM

## 2015-05-14 DIAGNOSIS — Z331 Pregnant state, incidental: Secondary | ICD-10-CM

## 2015-05-14 DIAGNOSIS — Z1389 Encounter for screening for other disorder: Secondary | ICD-10-CM

## 2015-05-14 LAB — POCT URINALYSIS DIPSTICK
GLUCOSE UA: NEGATIVE
LEUKOCYTES UA: NEGATIVE
Nitrite, UA: NEGATIVE
PROTEIN UA: NEGATIVE
RBC UA: NEGATIVE

## 2015-05-14 NOTE — Progress Notes (Signed)
Low-risk OB appointment G1P0 [redacted]w[redacted]d Estimated Date of Delivery: 08/11/15 BP 128/76 mmHg  Pulse 88  Wt 236 lb (107.049 kg)  LMP 10/25/2014 (Approximate)  BP, weight, and urine reviewed.  Refer to obstetrical flow sheet for FH & FHR.  Reports good fm.  Denies regular uc's, lof, vb, or uti s/s. N/V yesterday, felt hot- didn't have breakfast- felt much better after drinking OJ. Put in 2wk notice at work- states getting hard working. Discussed eating small frequent meals/snacks w/ protein. Last THC about 69mth ago- advised complete cessation, will recheck next visit.  Reviewed ptl s/s, fkc. Recommended Tdap at HD/PCP per CDC guidelines.  Plan:  Continue routine obstetrical care  F/U in 3wks for OB appointment  PN2 today

## 2015-05-14 NOTE — Patient Instructions (Signed)
Call the office [phone]) or go to [facility] if:  You begin to have strong, frequent contractions  Your water breaks.  Sometimes it is a big gush of fluid, sometimes it is just a trickle that keeps getting your panties wet or running down your legs  You have vaginal bleeding.  It is normal to have a small amount of spotting if your cervix was checked.   You don't feel your baby moving like normal.  If you don't, get you something to eat and drink and lay down and focus on feeling your baby move.  You should feel at least 10 movements in 2 hours.  If you don't, you should call the office or go to [facility].    Tdap Vaccine  It is recommended that you get the Tdap vaccine during the third trimester of EACH pregnancy to help protect your baby from getting pertussis (whooping cough)  27-36 weeks is the BEST time to do this so that you can pass the protection on to your baby. During pregnancy is better than after pregnancy, but if you are unable to get it during pregnancy it will be offered at the hospital.   You can get this vaccine at the health department or your family doctor  Everyone who will be around your baby should also be up-to-date on their vaccines. Adults (who are not pregnant) only need 1 dose of Tdap during adulthood.   For Dizzy Spells:   This is usually related to either your blood sugar or your blood pressure dropping  Make sure you are staying well hydrated and drinking enough water so that your urine is clear  Eat small frequent meals and snacks containing protein (meat, eggs, nuts, cheese) so that your blood sugar doesn't drop  If you do get dizzy, sit/lay down and get you something to drink and a snack containing protein- you will usually start feeling better in 10-20 minutes     Third Trimester of Pregnancy The third trimester is from week 29 through week 42, months 7 through 9. The third trimester is a time when the fetus is growing rapidly. At the  end of the ninth month, the fetus is about 20 inches in length and weighs 6-10 pounds.  BODY CHANGES Your body goes through many changes during pregnancy. The changes vary from woman to woman.  12. Your weight will continue to increase. You can expect to gain 25-35 pounds (11-16 kg) by the end of the pregnancy. 13. You may begin to get stretch marks on your hips, abdomen, and breasts. 30. You may urinate more often because the fetus is moving lower into your pelvis and pressing on your bladder. 15. You may develop or continue to have heartburn as a result of your pregnancy. 16. You may develop constipation because certain hormones are causing the muscles that push waste through your intestines to slow down. 17. You may develop hemorrhoids or swollen, bulging veins (varicose veins). 45. You may have pelvic pain because of the weight gain and pregnancy hormones relaxing your joints between the bones in your pelvis. Backaches may result from overexertion of the muscles supporting your posture. 19. You may have changes in your hair. These can include thickening of your hair, rapid growth, and changes in texture. Some women also have hair loss during or after pregnancy, or hair that feels dry or thin. Your hair will most likely return to normal after your baby is born. 20. Your breasts will continue to grow  and be tender. A yellow discharge may leak from your breasts called colostrum. 21. Your belly button may stick out. 61. You may feel short of breath because of your expanding uterus. 81. You may notice the fetus "dropping," or moving lower in your abdomen. 24. You may have a bloody mucus discharge. This usually occurs a few days to a week before labor begins. 25. Your cervix becomes thin and soft (effaced) near your due date. WHAT TO EXPECT AT YOUR PRENATAL EXAMS  You will have prenatal exams every 2 weeks until week 36. Then, you will have weekly prenatal exams. During a routine prenatal  visit:  You will be weighed to make sure you and the fetus are growing normally.  Your blood pressure is taken.  Your abdomen will be measured to track your baby's growth.  The fetal heartbeat will be listened to.  Any test results from the previous visit will be discussed.  You may have a cervical check near your due date to see if you have effaced. At around 36 weeks, your caregiver will check your cervix. At the same time, your caregiver will also perform a test on the secretions of the vaginal tissue. This test is to determine if a type of bacteria, Group B streptococcus, is present. Your caregiver will explain this further. Your caregiver may ask you:  What your birth plan is.  How you are feeling.  If you are feeling the baby move.  If you have had any abnormal symptoms, such as leaking fluid, bleeding, severe headaches, or abdominal cramping.  If you have any questions. Other tests or screenings that may be performed during your third trimester include:  Blood tests that check for low iron levels (anemia).  Fetal testing to check the health, activity level, and growth of the fetus. Testing is done if you have certain medical conditions or if there are problems during the pregnancy. FALSE LABOR You may feel small, irregular contractions that eventually go away. These are called Braxton Hicks contractions, or false labor. Contractions may last for hours, days, or even weeks before true labor sets in. If contractions come at regular intervals, intensify, or become painful, it is best to be seen by your caregiver.  SIGNS OF LABOR   Menstrual-like cramps.  Contractions that are 5 minutes apart or less.  Contractions that start on the top of the uterus and spread down to the lower abdomen and back.  A sense of increased pelvic pressure or back pain.  A watery or bloody mucus discharge that comes from the vagina. If you have any of these signs before the 37th week of  pregnancy, call your caregiver right away. You need to go to the hospital to get checked immediately. HOME CARE INSTRUCTIONS   Avoid all smoking, herbs, alcohol, and unprescribed drugs. These chemicals affect the formation and growth of the baby.  Follow your caregiver's instructions regarding medicine use. There are medicines that are either safe or unsafe to take during pregnancy.  Exercise only as directed by your caregiver. Experiencing uterine cramps is a good sign to stop exercising.  Continue to eat regular, healthy meals.  Wear a good support bra for breast tenderness.  Do not use hot tubs, steam rooms, or saunas.  Wear your seat belt at all times when driving.  Avoid raw meat, uncooked cheese, cat litter boxes, and soil used by cats. These carry germs that can cause birth defects in the baby.  Take your prenatal vitamins.  Try  taking a stool softener (if your caregiver approves) if you develop constipation. Eat more high-fiber foods, such as fresh vegetables or fruit and whole grains. Drink plenty of fluids to keep your urine clear or pale yellow.  Take warm sitz baths to soothe any pain or discomfort caused by hemorrhoids. Use hemorrhoid cream if your caregiver approves.  If you develop varicose veins, wear support hose. Elevate your feet for 15 minutes, 3-4 times a day. Limit salt in your diet.  Avoid heavy lifting, wear low heal shoes, and practice good posture.  Rest a lot with your legs elevated if you have leg cramps or low back pain.  Visit your dentist if you have not gone during your pregnancy. Use a soft toothbrush to brush your teeth and be gentle when you floss.  A sexual relationship may be continued unless your caregiver directs you otherwise.  Do not travel far distances unless it is absolutely necessary and only with the approval of your caregiver.  Take prenatal classes to understand, practice, and ask questions about the labor and delivery.  Make a  trial run to the hospital.  Pack your hospital bag.  Prepare the baby's nursery.  Continue to go to all your prenatal visits as directed by your caregiver. SEEK MEDICAL CARE IF:  You are unsure if you are in labor or if your water has broken.  You have dizziness.  You have mild pelvic cramps, pelvic pressure, or nagging pain in your abdominal area.  You have persistent nausea, vomiting, or diarrhea.  You have a bad smelling vaginal discharge.  You have pain with urination. SEEK IMMEDIATE MEDICAL CARE IF:   You have a fever.  You are leaking fluid from your vagina.  You have spotting or bleeding from your vagina.  You have severe abdominal cramping or pain.  You have rapid weight loss or gain.  You have shortness of breath with chest pain.  You notice sudden or extreme swelling of your face, hands, ankles, feet, or legs.  You have not felt your baby move in over an hour.  You have severe headaches that do not go away with medicine.  You have vision changes. Document Released: 09/15/2001 Document Revised: 09/26/2013 Document Reviewed: 11/22/2012 [facility] Patient Information 2015 [place], [place]. This information is not intended to replace advice given to you by your health care provider. Make sure you discuss any questions you have with your health care provider.

## 2015-05-15 LAB — HSV 2 ANTIBODY, IGG

## 2015-05-15 LAB — ANTIBODY SCREEN: Antibody Screen: NEGATIVE

## 2015-05-15 LAB — CBC
HEMATOCRIT: 40.6 % (ref 34.0–46.6)
HEMOGLOBIN: 14.1 g/dL (ref 11.1–15.9)
MCH: 32.8 pg (ref 26.6–33.0)
MCHC: 34.7 g/dL (ref 31.5–35.7)
MCV: 94 fL (ref 79–97)
Platelets: 163 10*3/uL (ref 150–379)
RBC: 4.3 x10E6/uL (ref 3.77–5.28)
RDW: 13.4 % (ref 12.3–15.4)
WBC: 16.7 10*3/uL — ABNORMAL HIGH (ref 3.4–10.8)

## 2015-05-15 LAB — GLUCOSE TOLERANCE, 2 HOURS W/ 1HR
Glucose, 1 hour: 155 mg/dL (ref 65–179)
Glucose, 2 hour: 94 mg/dL (ref 65–152)
Glucose, Fasting: 88 mg/dL (ref 65–91)

## 2015-05-15 LAB — HIV ANTIBODY (ROUTINE TESTING W REFLEX): HIV SCREEN 4TH GENERATION: NONREACTIVE

## 2015-05-15 LAB — RPR: RPR Ser Ql: NONREACTIVE

## 2015-06-04 ENCOUNTER — Ambulatory Visit (INDEPENDENT_AMBULATORY_CARE_PROVIDER_SITE_OTHER): Payer: Medicaid Other | Admitting: Women's Health

## 2015-06-04 ENCOUNTER — Encounter: Payer: Self-pay | Admitting: Women's Health

## 2015-06-04 VITALS — BP 112/80 | HR 88 | Wt 234.0 lb

## 2015-06-04 DIAGNOSIS — O26843 Uterine size-date discrepancy, third trimester: Secondary | ICD-10-CM

## 2015-06-04 DIAGNOSIS — D259 Leiomyoma of uterus, unspecified: Secondary | ICD-10-CM

## 2015-06-04 DIAGNOSIS — O3410 Maternal care for benign tumor of corpus uteri, unspecified trimester: Secondary | ICD-10-CM

## 2015-06-04 DIAGNOSIS — Z331 Pregnant state, incidental: Secondary | ICD-10-CM

## 2015-06-04 DIAGNOSIS — Z1389 Encounter for screening for other disorder: Secondary | ICD-10-CM

## 2015-06-04 DIAGNOSIS — Z3403 Encounter for supervision of normal first pregnancy, third trimester: Secondary | ICD-10-CM

## 2015-06-04 LAB — POCT URINALYSIS DIPSTICK
Glucose, UA: NEGATIVE
Ketones, UA: NEGATIVE
Leukocytes, UA: NEGATIVE
NITRITE UA: NEGATIVE
Protein, UA: NEGATIVE
RBC UA: NEGATIVE

## 2015-06-04 NOTE — Patient Instructions (Addendum)
Call the office [phone]) or go to [facility] if:  You begin to have strong, frequent contractions  Your water breaks.  Sometimes it is a big gush of fluid, sometimes it is just a trickle that keeps getting your panties wet or running down your legs  You have vaginal bleeding.  It is normal to have a small amount of spotting if your cervix was checked.   You don't feel your baby moving like normal.  If you don't, get you something to eat and drink and lay down and focus on feeling your baby move.  You should feel at least 10 movements in 2 hours.  If you don't, you should call the office or go to [place] have a viral infection that will resolve on its own over time.  Symptoms typically last 3-7 days but can stretch out to 2-3 weeks.  Unfortunately, antibiotics are not helpful for viral infections.  Humidifier and saline nasal spray for nasal congestion  Regular robitussin, cough drops for cough  Warm salt water gargles for sore throat  Mucinex with lots of water to help you cough up the mucous in your chest if needed  Drink plenty of fluids and stay hydrated!  Wash your hands frequently.  Call if you are not improving by 7-10 days.     Preterm Labor Information Preterm labor is when labor starts at less than 37 weeks of pregnancy. The normal length of a pregnancy is 39 to 41 weeks. CAUSES Often, there is no identifiable underlying cause as to why a woman goes into preterm labor. One of the most common known causes of preterm labor is infection. Infections of the uterus, cervix, vagina, amniotic sac, bladder, kidney, or even the lungs (pneumonia) can cause labor to start. Other suspected causes of preterm labor include:  12. Urogenital infections, such as yeast infections and bacterial vaginosis.  13. Uterine abnormalities (uterine shape, uterine septum, fibroids, or bleeding from the placenta).  14. A cervix that has been operated on (it may fail to stay  closed).  15. Malformations in the fetus.  16. Multiple gestations (twins, triplets, and so on).  17. Breakage of the amniotic sac.  RISK FACTORS  Having a previous history of preterm labor.   Having premature rupture of membranes (PROM).   Having a placenta that covers the opening of the cervix (placenta previa).   Having a placenta that separates from the uterus (placental abruption).   Having a cervix that is too weak to hold the fetus in the uterus (incompetent cervix).   Having too much fluid in the amniotic sac (polyhydramnios).   Taking illegal drugs or smoking while pregnant.   Not gaining enough weight while pregnant.   Being younger than 44 and older than 25 years old.   Having a low socioeconomic status.   Being African American. SYMPTOMS Signs and symptoms of preterm labor include:   Menstrual-like cramps, abdominal pain, or back pain.  Uterine contractions that are regular, as frequent as six in an hour, regardless of their intensity (may be mild or painful).  Contractions that start on the top of the uterus and spread down to the lower abdomen and back.   A sense of increased pelvic pressure.   A watery or bloody mucus discharge that comes from the vagina.  TREATMENT Depending on the length of the pregnancy and other circumstances, your health care provider may suggest bed rest. If necessary, there are medicines that can be  given to stop contractions and to mature the fetal lungs. If labor happens before 34 weeks of pregnancy, a prolonged hospital stay may be recommended. Treatment depends on the condition of both you and the fetus.  WHAT SHOULD YOU DO IF YOU THINK YOU ARE IN PRETERM LABOR? Call your health care provider right away. You will need to go to the hospital to get checked immediately. HOW CAN YOU PREVENT PRETERM LABOR IN FUTURE PREGNANCIES? You should:   Stop smoking if you smoke.  Maintain healthy weight gain and avoid  chemicals and drugs that are not necessary.  Be watchful for any type of infection.  Inform your health care provider if you have a known history of preterm labor. Document Released: 12/12/2003 Document Revised: 05/24/2013 Document Reviewed: 10/24/2012 [facility] Patient Information 2015 [place], [place]. This information is not intended to replace advice given to you by your health care provider. Make sure you discuss any questions you have with your health care provider.

## 2015-06-04 NOTE — Progress Notes (Signed)
Low-risk OB appointment G1P0 [redacted]w[redacted]d Estimated Date of Delivery: 08/11/15 BP 112/80 mmHg  Pulse 88  Wt 234 lb (106.142 kg)  LMP 10/25/2014 (Approximate)  BP, weight, and urine reviewed.  Refer to obstetrical flow sheet for FH & FHR.  Reports good fm.  Denies regular uc's, lof, vb, or uti s/s. Cold x 3 days- productive cough, runny nose, congestion. No sore throat/fever/chills.  HRRR, LCTAB. Reviewed relief measures- gave printed info. To call if not improving after 7-10d or worsening.  Going to [employer]- coming home tues. To get out q 1hr to walk around, move/exercise legs while in car.  Reviewed ptl s/s, fkc, normal pn2 results. Plan:  Continue routine obstetrical care  F/U in asap for efw/afi u/s for s>d also assess fibroids (no visit), then 2wks for OB appointment

## 2015-06-05 ENCOUNTER — Ambulatory Visit (INDEPENDENT_AMBULATORY_CARE_PROVIDER_SITE_OTHER): Payer: Medicaid Other

## 2015-06-05 DIAGNOSIS — D259 Leiomyoma of uterus, unspecified: Secondary | ICD-10-CM

## 2015-06-05 DIAGNOSIS — O26843 Uterine size-date discrepancy, third trimester: Secondary | ICD-10-CM | POA: Diagnosis not present

## 2015-06-05 DIAGNOSIS — O3411 Maternal care for benign tumor of corpus uteri, first trimester: Secondary | ICD-10-CM

## 2015-06-05 DIAGNOSIS — O3410 Maternal care for benign tumor of corpus uteri, unspecified trimester: Secondary | ICD-10-CM

## 2015-06-05 NOTE — Progress Notes (Signed)
[place] 30+3wks,efw 1632g,ant pl gr 3,afi 16.7cm,fht 156bpm,normal ov's bilat, unable to see fibroids,cephalic,cx 0.2HE

## 2015-06-12 ENCOUNTER — Telehealth: Payer: Self-pay | Admitting: *Deleted

## 2015-06-12 NOTE — Telephone Encounter (Signed)
Pt c/o " a lot of contraction x 3 days,no vaginal bleeding or gush of fluids, + FM. Pt states has push water and rest, helps some but no complete relief. Pt states she does not think this is normal. Informed pt normal to have Braxton Hicks contractions at 31 weeks of pregnancy but since she is concerned would work her in to soonest available appt. Pt verbalized understanding.

## 2015-06-13 ENCOUNTER — Encounter: Payer: Self-pay | Admitting: Obstetrics & Gynecology

## 2015-06-18 ENCOUNTER — Ambulatory Visit (INDEPENDENT_AMBULATORY_CARE_PROVIDER_SITE_OTHER): Payer: Medicaid Other | Admitting: Women's Health

## 2015-06-18 ENCOUNTER — Encounter: Payer: Self-pay | Admitting: Women's Health

## 2015-06-18 VITALS — BP 120/62 | HR 88 | Wt 235.0 lb

## 2015-06-18 DIAGNOSIS — B9689 Other specified bacterial agents as the cause of diseases classified elsewhere: Secondary | ICD-10-CM

## 2015-06-18 DIAGNOSIS — Z1389 Encounter for screening for other disorder: Secondary | ICD-10-CM

## 2015-06-18 DIAGNOSIS — N898 Other specified noninflammatory disorders of vagina: Secondary | ICD-10-CM

## 2015-06-18 DIAGNOSIS — Z331 Pregnant state, incidental: Secondary | ICD-10-CM

## 2015-06-18 DIAGNOSIS — N760 Acute vaginitis: Secondary | ICD-10-CM

## 2015-06-18 DIAGNOSIS — O26893 Other specified pregnancy related conditions, third trimester: Secondary | ICD-10-CM

## 2015-06-18 DIAGNOSIS — Z3403 Encounter for supervision of normal first pregnancy, third trimester: Secondary | ICD-10-CM

## 2015-06-18 LAB — POCT URINALYSIS DIPSTICK
Blood, UA: NEGATIVE
GLUCOSE UA: NEGATIVE
KETONES UA: NEGATIVE
LEUKOCYTES UA: NEGATIVE
Nitrite, UA: NEGATIVE
Protein, UA: NEGATIVE

## 2015-06-18 LAB — POCT WET PREP (WET MOUNT): CLUE CELLS WET PREP WHIFF POC: POSITIVE

## 2015-06-18 MED ORDER — METRONIDAZOLE 500 MG PO TABS: 500.0000 mg | ORAL_TABLET | Freq: Two times a day (BID) | ORAL | Status: DC

## 2015-06-18 NOTE — Progress Notes (Signed)
Low-risk OB appointment G1P0 [redacted]w[redacted]d Estimated Date of Delivery: 08/11/15 BP 120/62 mmHg  Pulse 88  Wt 235 lb (106.595 kg)  LMP 10/25/2014 (Approximate)  BP, weight, and urine reviewed.  Refer to obstetrical flow sheet for FH & FHR.  Reports good fm.  Denies regular uc's, lof, vb, or uti s/s. Had consistent uc's over weekend x 3 days, walked a lot at beach, and had to walk 15 flights of stairs twice d/t power being out at hotel.  Spec exam: cx visually closed, mod amount thin white malodorous d/c, wet prep: many clues=BV, Rx metronidazole 500mg  BID x 7d for BV, no sex or etoh while taking  SVE: LTC Reviewed ptl s/s, fkc.  Plan:  Continue routine obstetrical care  F/U in 2wks for OB appointment

## 2015-06-18 NOTE — Patient Instructions (Signed)
Call the office ([phone]) or go to [facility] if:  You begin to have strong, frequent contractions  Your water breaks.  Sometimes it is a big gush of fluid, sometimes it is just a trickle that keeps getting your panties wet or running down your legs  You have vaginal bleeding.  It is normal to have a small amount of spotting if your cervix was checked.   You don't feel your baby moving like normal.  If you don't, get you something to eat and drink and lay down and focus on feeling your baby move.  You should feel at least 10 movements in 2 hours.  If you don't, you should call the office or go to [facility].    Preterm Labor Information Preterm labor is when labor starts at less than 37 weeks of pregnancy. The normal length of a pregnancy is 39 to 41 weeks. CAUSES Often, there is no identifiable underlying cause as to why a woman goes into preterm labor. One of the most common known causes of preterm labor is infection. Infections of the uterus, cervix, vagina, amniotic sac, bladder, kidney, or even the lungs (pneumonia) can cause labor to start. Other suspected causes of preterm labor include:   Urogenital infections, such as yeast infections and bacterial vaginosis.   Uterine abnormalities (uterine shape, uterine septum, fibroids, or bleeding from the placenta).   A cervix that has been operated on (it may fail to stay closed).   Malformations in the fetus.   Multiple gestations (twins, triplets, and so on).   Breakage of the amniotic sac.  RISK FACTORS  Having a previous history of preterm labor.   Having premature rupture of membranes (PROM).   Having a placenta that covers the opening of the cervix (placenta previa).   Having a placenta that separates from the uterus (placental abruption).   Having a cervix that is too weak to hold the fetus in the uterus (incompetent cervix).   Having too much fluid in the amniotic sac (polyhydramnios).   Taking  illegal drugs or smoking while pregnant.   Not gaining enough weight while pregnant.   Being younger than 18 and older than 25 years old.   Having a low socioeconomic status.   Being African American. SYMPTOMS Signs and symptoms of preterm labor include:   Menstrual-like cramps, abdominal pain, or back pain.  Uterine contractions that are regular, as frequent as six in an hour, regardless of their intensity (may be mild or painful).  Contractions that start on the top of the uterus and spread down to the lower abdomen and back.   A sense of increased pelvic pressure.   A watery or bloody mucus discharge that comes from the vagina.  TREATMENT Depending on the length of the pregnancy and other circumstances, your health care provider may suggest bed rest. If necessary, there are medicines that can be given to stop contractions and to mature the fetal lungs. If labor happens before 34 weeks of pregnancy, a prolonged hospital stay may be recommended. Treatment depends on the condition of both you and the fetus.  WHAT SHOULD YOU DO IF YOU THINK YOU ARE IN PRETERM LABOR? Call your health care provider right away. You will need to go to the hospital to get checked immediately. HOW CAN YOU PREVENT PRETERM LABOR IN FUTURE PREGNANCIES? You should:   Stop smoking if you smoke.  Maintain healthy weight gain and avoid chemicals and drugs that are not necessary.  Be watchful for   any type of infection.  Inform your health care provider if you have a known history of preterm labor. Document Released: 12/12/2003 Document Revised: 05/24/2013 Document Reviewed: 10/24/2012 [facility] Patient Information 2015 [place], [place]. This information is not intended to replace advice given to you by your health care provider. Make sure you discuss any questions you have with your health care provider.  Bacterial Vaginosis Bacterial vaginosis is a vaginal infection that occurs when the normal balance  of bacteria in the vagina is disrupted. It results from an overgrowth of certain bacteria. This is the most common vaginal infection in women of childbearing age. Treatment is important to prevent complications, especially in pregnant women, as it can cause a premature delivery. CAUSES  Bacterial vaginosis is caused by an increase in harmful bacteria that are normally present in smaller amounts in the vagina. Several different kinds of bacteria can cause bacterial vaginosis. However, the reason that the condition develops is not fully understood. RISK FACTORS Certain activities or behaviors can put you at an increased risk of developing bacterial vaginosis, including:  Having a new sex partner or multiple sex partners.  Douching.  Using an intrauterine device (IUD) for contraception. Women do not get bacterial vaginosis from toilet seats, bedding, swimming pools, or contact with objects around them. SIGNS AND SYMPTOMS  Some women with bacterial vaginosis have no signs or symptoms. Common symptoms include:  Grey vaginal discharge.  A fishlike odor with discharge, especially after sexual intercourse.  Itching or burning of the vagina and vulva.  Burning or pain with urination. DIAGNOSIS  Your health care provider will take a medical history and examine the vagina for signs of bacterial vaginosis. A sample of vaginal fluid may be taken. Your health care provider will look at this sample under a microscope to check for bacteria and abnormal cells. A vaginal pH test may also be done.  TREATMENT  Bacterial vaginosis may be treated with antibiotic medicines. These may be given in the form of a pill or a vaginal cream. A second round of antibiotics may be prescribed if the condition comes back after treatment.  HOME CARE INSTRUCTIONS   Only take over-the-counter or prescription medicines as directed by your health care provider.  If antibiotic medicine was prescribed, take it as directed. Make  sure you finish it even if you start to feel better.  Do not have sex until treatment is completed.  Tell all sexual partners that you have a vaginal infection. They should see their health care provider and be treated if they have problems, such as a mild rash or itching.  Practice safe sex by using condoms and only having one sex partner. SEEK MEDICAL CARE IF:   Your symptoms are not improving after 3 days of treatment.  You have increased discharge or pain.  You have a fever. MAKE SURE YOU:   Understand these instructions.  Will watch your condition.  Will get help right away if you are not doing well or get worse. FOR MORE INFORMATION  Centers for Disease Control and Prevention, Division of STD Prevention: [employer] American Sexual Health Association (ASHA): www.ashastd.org  Document Released: 09/21/2005 Document Revised: 07/12/2013 Document Reviewed: 05/03/2013 [facility] Patient Information 2015 [place], [place]. This information is not intended to replace advice given to you by your health care provider. Make sure you discuss any questions you have with your health care provider.

## 2015-06-21 ENCOUNTER — Inpatient Hospital Stay (HOSPITAL_COMMUNITY)
Admission: AD | Admit: 2015-06-21 | Discharge: 2015-06-21 | Disposition: A | Discharge status: Home / Self Care | Payer: Medicaid Other | Source: Ambulatory Visit | Attending: Obstetrics and Gynecology | Admitting: Obstetrics and Gynecology

## 2015-06-21 ENCOUNTER — Encounter (HOSPITAL_COMMUNITY): Payer: Self-pay | Admitting: *Deleted

## 2015-06-21 DIAGNOSIS — O26893 Other specified pregnancy related conditions, third trimester: Secondary | ICD-10-CM | POA: Diagnosis not present

## 2015-06-21 DIAGNOSIS — Z880 Allergy status to penicillin: Secondary | ICD-10-CM | POA: Insufficient documentation

## 2015-06-21 DIAGNOSIS — Z3A32 32 weeks gestation of pregnancy: Secondary | ICD-10-CM | POA: Insufficient documentation

## 2015-06-21 DIAGNOSIS — R109 Unspecified abdominal pain: Secondary | ICD-10-CM | POA: Insufficient documentation

## 2015-06-21 DIAGNOSIS — R197 Diarrhea, unspecified: Secondary | ICD-10-CM | POA: Diagnosis not present

## 2015-06-21 DIAGNOSIS — R112 Nausea with vomiting, unspecified: Secondary | ICD-10-CM | POA: Diagnosis not present

## 2015-06-21 LAB — URINALYSIS, ROUTINE W REFLEX MICROSCOPIC
BILIRUBIN URINE: NEGATIVE
Glucose, UA: NEGATIVE mg/dL
Hgb urine dipstick: NEGATIVE
Ketones, ur: NEGATIVE mg/dL
LEUKOCYTES UA: NEGATIVE
NITRITE: NEGATIVE
PH: 6 (ref 5.0–8.0)
Protein, ur: NEGATIVE mg/dL
SPECIFIC GRAVITY, URINE: 1.025 (ref 1.005–1.030)
Urobilinogen, UA: 0.2 mg/dL (ref 0.0–1.0)

## 2015-06-21 MED ORDER — CLINDAMYCIN HCL 300 MG PO CAPS: 300.0000 mg | ORAL_CAPSULE | Freq: Two times a day (BID) | ORAL | Status: DC

## 2015-06-21 NOTE — MAU Note (Signed)
Patient was diagnosed with BV and given Flagyl to take since Wednesday.  Abdominal pain and vomiting started Wednesday evening and diarrhea started last night.  Patient states having diarrhea 5 times in past two days.  No vaginal bleeding or discharge.  Reports +fetal movement.

## 2015-06-21 NOTE — MAU Provider Note (Signed)
History     CSN: [id number]  Arrival date and time: 06/21/15 0800   First Arlene Genova Initiated Contact with Patient 06/21/15 [phone]      Chief Complaint  Patient presents with  . Nausea  . Diarrhea  . Abdominal Pain   HPI  Patient is 24 y.o. G1P0 [redacted]w[redacted]d here with complaints of abdominal pain, vomiting, and diarrhea.  Ms. Friebel reports that she was diagnosed with asymptomatic BV two days ago, and began taking metronidazole at that time. She reports that she subsequently developed both sharp and dull lower abdominal pains, diarrhea, and vomiting. She denies blood in either her diarrhea or vomitus. She reports that her vomit was mucinous, and her diarrhea was watery. She took four doses of metronidazole, but has not taken any this AM. She has not noticed improvement in her symptoms since not taking a dose today. She has not taken anything to alleviate her symptoms.  +FM, denies LOF, VB, contractions, vaginal discharge.   Past Medical History  Diagnosis Date  . UTI (lower urinary tract infection)   . Pregnant 12/10/2014    Past Surgical History  Procedure Laterality Date  . No past surgeries      History reviewed. No pertinent family history.  Social History  Substance Use Topics  . Smoking status: Current Some Day Smoker -- 0.50 packs/day for 5 years    Types: Cigarettes  . Smokeless tobacco: Never Used  . Alcohol Use: No    Allergies:  Allergies  Allergen Reactions  . Penicillins Hives    No prescriptions prior to admission    Review of Systems  Cardiovascular: Positive for leg swelling.  Gastrointestinal: Positive for nausea, vomiting, abdominal pain and diarrhea. Negative for constipation, blood in stool and melena.   Physical Exam   Blood pressure 113/81, pulse 79, temperature 97.6 F (36.4 C), temperature source Oral, resp. rate 16, height 5\' 3"  (1.6 m), last menstrual period 10/25/2014, SpO2 100 %.  Physical Exam  Nursing note and vitals  reviewed. Constitutional: She is oriented to person, place, and time. She appears well-developed and well-nourished. No distress.  HENT:  Head: Normocephalic and atraumatic.  Cardiovascular: Normal rate.   Respiratory: Effort normal. No respiratory distress.  GI: Soft. She exhibits no distension. There is no tenderness. There is no rebound and no guarding.  Gravid  Musculoskeletal: She exhibits no edema or tenderness.  Neurological: She is alert and oriented to person, place, and time. No cranial nerve deficit.  Skin: Skin is warm and dry.  Psychiatric: She has a normal mood and affect. Her behavior is normal.    MAU Course  Procedures None  MDM UA - unremarkable, no signs of infection  Assessment and Plan  A: Patient is 24 y.o. G1P0 [redacted]w[redacted]d reporting abdominal pain, diarrhea, and vomiting likely secondary to metronidazole.  Clindamycin is second-line drug for BV, so will d/c Flagyl and begin clinda. Patient has completed two days of Flagyl, so will prescribe 5 days of clinda to complete seven day treatment course. Patient educated about potential SE of clinda including N/V and given suggestions for ways to prevent/alleviate symptoms.   P: Discharge home - Reviewed findings and my conclusion - Clinda 300 mg BID x 5 days - Handout given regarding SE of clindamycin - Follow-up with OB [place] [facility] - Instructed to return if sx do not improve and pt cannot tolerate fluids    Adin Hector, MD PGY-1 [facility]  06/21/2015, 10:26 AM   I have participated in  the care of this patient and I agree with the above. Serita Grammes CNM 7:31 PM 06/21/2015

## 2015-06-21 NOTE — Discharge Instructions (Signed)
Clindamycin capsules  What is this medicine?  CLINDAMYCIN (KLIN da MYE sin) is a lincosamide antibiotic. It is used to treat certain kinds of bacterial infections. It will not work for colds, flu, or other viral infections.  This medicine may be used for other purposes; ask your health care provider or pharmacist if you have questions.  COMMON BRAND NAME(S): Cleocin  What should I tell my health care provider before I take this medicine?  They need to know if you have any of these conditions:  -kidney disease  -liver disease  -stomach problems like colitis  -an unusual or allergic reaction to clindamycin, lincomycin, or other medicines, foods, dyes like tartrazine or preservatives  -pregnant or trying to get pregnant  -breast-feeding  How should I use this medicine?  Take this medicine by mouth with a full glass of water. Follow the directions on the prescription label. You can take this medicine with food or on an empty stomach. If the medicine upsets your stomach, take it with food. Take your medicine at regular intervals. Do not take your medicine more often than directed. Take all of your medicine as directed even if you think your are better. Do not skip doses or stop your medicine early.  Talk to your pediatrician regarding the use of this medicine in children. Special care may be needed.  Overdosage: If you think you have taken too much of this medicine contact a poison control center or emergency room at once.  NOTE: This medicine is only for you. Do not share this medicine with others.  What if I miss a dose?  If you miss a dose, take it as soon as you can. If it is almost time for your next dose, take only that dose. Do not take double or extra doses.  What may interact with this medicine?  -birth control pills  -chloramphenicol  -erythromycin  -kaolin products  This list may not describe all possible interactions. Give your health care provider a list of all the medicines, herbs, non-prescription drugs,  or dietary supplements you use. Also tell them if you smoke, drink alcohol, or use illegal drugs. Some items may interact with your medicine.  What should I watch for while using this medicine?  Tell your doctor or healthcare professional if your symptoms do not start to get better or if they get worse.  Do not treat diarrhea with over the counter products. Contact your doctor if you have diarrhea that lasts more than 2 days or if it is severe and watery.  What side effects may I notice from receiving this medicine?  Side effects that you should report to your doctor or health care professional as soon as possible:  -allergic reactions like skin rash, itching or hives, swelling of the face, lips, or tongue  -dark urine  -pain on swallowing  -redness, blistering, peeling or loosening of the skin, including inside the mouth  -unusual bleeding or bruising  -unusually weak or tired  -yellowing of eyes or skin  Side effects that usually do not require medical attention (report to your doctor or health care professional if they continue or are bothersome):  -diarrhea  -itching in the rectal or genital area  -joint pain  -nausea, vomiting  -stomach pain  This list may not describe all possible side effects. Call your doctor for medical advice about side effects. You may report side effects to FDA at 1-800-FDA-1088.  Where should I keep my medicine?  Keep out of the   reach of children.  Store at room temperature between 20 and 25 degrees C (68 and 77 degrees F). Throw away any unused medicine after the expiration date.  NOTE: This sheet is a summary. It may not cover all possible information. If you have questions about this medicine, talk to your doctor, pharmacist, or health care provider.   2015, [employer]. (2013-04-27 16:12:32)

## 2015-07-02 ENCOUNTER — Ambulatory Visit (INDEPENDENT_AMBULATORY_CARE_PROVIDER_SITE_OTHER): Payer: Medicaid Other | Admitting: Women's Health

## 2015-07-02 ENCOUNTER — Encounter: Payer: Self-pay | Admitting: Women's Health

## 2015-07-02 VITALS — BP 120/78 | HR 96 | Wt 241.0 lb

## 2015-07-02 DIAGNOSIS — Z23 Encounter for immunization: Secondary | ICD-10-CM | POA: Diagnosis not present

## 2015-07-02 DIAGNOSIS — Z3403 Encounter for supervision of normal first pregnancy, third trimester: Secondary | ICD-10-CM

## 2015-07-02 DIAGNOSIS — Z1389 Encounter for screening for other disorder: Secondary | ICD-10-CM

## 2015-07-02 DIAGNOSIS — Z331 Pregnant state, incidental: Secondary | ICD-10-CM

## 2015-07-02 LAB — POCT URINALYSIS DIPSTICK
Blood, UA: NEGATIVE
GLUCOSE UA: NEGATIVE
KETONES UA: NEGATIVE
Leukocytes, UA: NEGATIVE
Nitrite, UA: NEGATIVE
Protein, UA: NEGATIVE

## 2015-07-02 NOTE — Patient Instructions (Signed)
Call the office [phone]) or go to [facility] if:  You begin to have strong, frequent contractions  Your water breaks.  Sometimes it is a big gush of fluid, sometimes it is just a trickle that keeps getting your panties wet or running down your legs  You have vaginal bleeding.  It is normal to have a small amount of spotting if your cervix was checked.   You don't feel your baby moving like normal.  If you don't, get you something to eat and drink and lay down and focus on feeling your baby move.  You should feel at least 10 movements in 2 hours.  If you don't, you should call the office or go to [place] [facility]:  [place] [phone]                 [place] [phone] (usually not accepting new patients unless you have family there already, you are always welcome to call and ask)            [facility] ([address]) [phone]   [facility] Pediatricians/Family Doctors:   [facility]: [phone]  [facility]: [phone]   Preterm Labor Information Preterm labor is when labor starts at less than 37 weeks of pregnancy. The normal length of a pregnancy is 39 to 41 weeks. CAUSES Often, there is no identifiable underlying cause as to why a woman goes into preterm labor. One of the most common known causes of preterm labor is infection. Infections of the uterus, cervix, vagina, amniotic sac, bladder, kidney, or even the lungs (pneumonia) can cause labor to start. Other suspected causes of preterm labor include:   Urogenital infections, such as yeast infections and bacterial vaginosis.   Uterine abnormalities (uterine shape, uterine septum, fibroids, or bleeding from the placenta).   A cervix that has been operated on (it may fail to stay closed).   Malformations in the fetus.   Multiple  gestations (twins, triplets, and so on).   Breakage of the amniotic sac.  RISK FACTORS  Having a previous history of preterm labor.   Having premature rupture of membranes (PROM).   Having a placenta that covers the opening of the cervix (placenta previa).   Having a placenta that separates from the uterus (placental abruption).   Having a cervix that is too weak to hold the fetus in the uterus (incompetent cervix).   Having too much fluid in the amniotic sac (polyhydramnios).   Taking illegal drugs or smoking while pregnant.   Not gaining enough weight while pregnant.   Being younger than 50 and older than 25 years old.   Having a low socioeconomic status.   Being African American. SYMPTOMS Signs and symptoms of preterm labor include:   Menstrual-like cramps, abdominal pain, or back pain.  Uterine contractions that are regular, as frequent as six in an hour, regardless of their intensity (may be mild or painful).  Contractions that start on the top of the uterus and spread down to the lower abdomen and back.   A sense of increased pelvic pressure.   A watery or bloody mucus discharge that comes from the vagina.  TREATMENT Depending on the length of the pregnancy and other circumstances, your health care provider may suggest bed rest. If necessary, there are medicines that can be given to stop contractions and  to mature the fetal lungs. If labor happens before 34 weeks of pregnancy, a prolonged hospital stay may be recommended. Treatment depends on the condition of both you and the fetus.  WHAT SHOULD YOU DO IF YOU THINK YOU ARE IN PRETERM LABOR? Call your health care provider right away. You will need to go to the hospital to get checked immediately. HOW CAN YOU PREVENT PRETERM LABOR IN FUTURE PREGNANCIES? You should:   Stop smoking if you smoke.  Maintain healthy weight gain and avoid chemicals and drugs that are not necessary.  Be watchful for any  type of infection.  Inform your health care provider if you have a known history of preterm labor. Document Released: 12/12/2003 Document Revised: 05/24/2013 Document Reviewed: 10/24/2012 [facility] Patient Information 2015 [place], [place]. This information is not intended to replace advice given to you by your health care provider. Make sure you discuss any questions you have with your health care provider.

## 2015-07-02 NOTE — Progress Notes (Signed)
Low-risk OB appointment G1P0 [redacted]w[redacted]d Estimated Date of Delivery: 08/11/15 BP 120/78 mmHg  Pulse 96  Wt 241 lb (109.317 kg)  LMP 10/25/2014 (Approximate)  BP, weight, and urine reviewed.  Refer to obstetrical flow sheet for FH & FHR.  Reports good fm.  Denies regular uc's, lof, vb, or uti s/s. No complaints. Reviewed ptl s/s, fkc. Plan:  Continue routine obstetrical care  F/U in 2wks for OB appointment and gbs Flu shot today

## 2015-07-13 ENCOUNTER — Encounter (HOSPITAL_COMMUNITY): Payer: Self-pay | Admitting: *Deleted

## 2015-07-13 ENCOUNTER — Inpatient Hospital Stay (HOSPITAL_COMMUNITY)
Admission: AD | Admit: 2015-07-13 | Discharge: 2015-07-13 | Disposition: A | Discharge status: Home / Self Care | Payer: Medicaid Other | Source: Ambulatory Visit | Attending: Obstetrics and Gynecology | Admitting: Obstetrics and Gynecology

## 2015-07-13 DIAGNOSIS — N898 Other specified noninflammatory disorders of vagina: Secondary | ICD-10-CM | POA: Diagnosis present

## 2015-07-13 DIAGNOSIS — Z3A35 35 weeks gestation of pregnancy: Secondary | ICD-10-CM | POA: Diagnosis not present

## 2015-07-13 DIAGNOSIS — O26893 Other specified pregnancy related conditions, third trimester: Secondary | ICD-10-CM | POA: Diagnosis not present

## 2015-07-13 LAB — POCT FERN TEST: POCT FERN TEST: NEGATIVE

## 2015-07-13 NOTE — MAU Note (Signed)
Pt states when she woke @ 0645 she noticed she was leaking clear fluid, is still leaking now.  Denies bleeding.  Has been having uc's since Thursday night but they are more intense this morning.

## 2015-07-13 NOTE — MAU Provider Note (Signed)
Patient is 24 y.o. G1P0 [redacted]w[redacted]d here with complaints of loss of fluid.  S:   Loss of fluid: -Noticed underwear was wet when woke up this morning -Not much fluid, felt "like discharge" -Did not soak through pants -Had vaginal intercourse last night -Called her doctor who advised her to get checked  +FM, denies VB. Has occasional contractions but mostly pelvic pressure. Light, clear vaginal discharge.  Physical Exam: Cardiac: RRR, S1, S2, no murmurs, rubs or gallops Pulmonary: CTAB Pelvic: No pooling on speculum exam. No blood. Scant clear discharge.   Tests: No ferning observed on sample  A: Patient is 24 y.o. G1P0 [redacted]w[redacted]d reporting loss of fluid. Likely normal vaginal discharge, possibly exacerbated by recent sexual intercourse.   P: Discharge home - Reviewed findings and my conclusion - Handout on normal third trimester given - Follow-up with OB provider

## 2015-07-13 NOTE — Discharge Instructions (Signed)
Third Trimester of Pregnancy °The third trimester is from week 29 through week 42, months 7 through 9. The third trimester is a time when the fetus is growing rapidly. At the end of the ninth month, the fetus is about 20 inches in length and weighs 6-10 pounds.  °BODY CHANGES °Your body goes through many changes during pregnancy. The changes vary from woman to woman.  °· Your weight will continue to increase. You can expect to gain 25-35 pounds (11-16 kg) by the end of the pregnancy. °· You may begin to get stretch marks on your hips, abdomen, and breasts. °· You may urinate more often because the fetus is moving lower into your pelvis and pressing on your bladder. °· You may develop or continue to have heartburn as a result of your pregnancy. °· You may develop constipation because certain hormones are causing the muscles that push waste through your intestines to slow down. °· You may develop hemorrhoids or swollen, bulging veins (varicose veins). °· You may have pelvic pain because of the weight gain and pregnancy hormones relaxing your joints between the bones in your pelvis. Backaches may result from overexertion of the muscles supporting your posture. °· You may have changes in your hair. These can include thickening of your hair, rapid growth, and changes in texture. Some women also have hair loss during or after pregnancy, or hair that feels dry or thin. Your hair will most likely return to normal after your baby is born. °· Your breasts will continue to grow and be tender. A yellow discharge may leak from your breasts called colostrum. °· Your belly button may stick out. °· You may feel short of breath because of your expanding uterus. °· You may notice the fetus "dropping," or moving lower in your abdomen. °· You may have a bloody mucus discharge. This usually occurs a few days to a week before labor begins. °· Your cervix becomes thin and soft (effaced) near your due date. °WHAT TO EXPECT AT YOUR PRENATAL  EXAMS  °You will have prenatal exams every 2 weeks until week 36. Then, you will have weekly prenatal exams. During a routine prenatal visit: °· You will be weighed to make sure you and the fetus are growing normally. °· Your blood pressure is taken. °· Your abdomen will be measured to track your baby's growth. °· The fetal heartbeat will be listened to. °· Any test results from the previous visit will be discussed. °· You may have a cervical check near your due date to see if you have effaced. °At around 36 weeks, your caregiver will check your cervix. At the same time, your caregiver will also perform a test on the secretions of the vaginal tissue. This test is to determine if a type of bacteria, Group B streptococcus, is present. Your caregiver will explain this further. °Your caregiver may ask you: °· What your birth plan is. °· How you are feeling. °· If you are feeling the baby move. °· If you have had any abnormal symptoms, such as leaking fluid, bleeding, severe headaches, or abdominal cramping. °· If you are using any tobacco products, including cigarettes, chewing tobacco, and electronic cigarettes. °· If you have any questions. °Other tests or screenings that may be performed during your third trimester include: °· Blood tests that check for low iron levels (anemia). °· Fetal testing to check the health, activity level, and growth of the fetus. Testing is done if you have certain medical conditions or if   there are problems during the pregnancy. °· HIV (human immunodeficiency virus) testing. If you are at high risk, you may be screened for HIV during your third trimester of pregnancy. °FALSE LABOR °You may feel small, irregular contractions that eventually go away. These are called Braxton Hicks contractions, or false labor. Contractions may last for hours, days, or even weeks before true labor sets in. If contractions come at regular intervals, intensify, or become painful, it is best to be seen by your  caregiver.  °SIGNS OF LABOR  °· Menstrual-like cramps. °· Contractions that are 5 minutes apart or less. °· Contractions that start on the top of the uterus and spread down to the lower abdomen and back. °· A sense of increased pelvic pressure or back pain. °· A watery or bloody mucus discharge that comes from the vagina. °If you have any of these signs before the 37th week of pregnancy, call your caregiver right away. You need to go to the hospital to get checked immediately. °HOME CARE INSTRUCTIONS  °· Avoid all smoking, herbs, alcohol, and unprescribed drugs. These chemicals affect the formation and growth of the baby. °· Do not use any tobacco products, including cigarettes, chewing tobacco, and electronic cigarettes. If you need help quitting, ask your health care provider. You may receive counseling support and other resources to help you quit. °· Follow your caregiver's instructions regarding medicine use. There are medicines that are either safe or unsafe to take during pregnancy. °· Exercise only as directed by your caregiver. Experiencing uterine cramps is a good sign to stop exercising. °· Continue to eat regular, healthy meals. °· Wear a good support bra for breast tenderness. °· Do not use hot tubs, steam rooms, or saunas. °· Wear your seat belt at all times when driving. °· Avoid raw meat, uncooked cheese, cat litter boxes, and soil used by cats. These carry germs that can cause birth defects in the baby. °· Take your prenatal vitamins. °· Take 1500-2000 mg of calcium daily starting at the 20th week of pregnancy until you deliver your baby. °· Try taking a stool softener (if your caregiver approves) if you develop constipation. Eat more high-fiber foods, such as fresh vegetables or fruit and whole grains. Drink plenty of fluids to keep your urine clear or pale yellow. °· Take warm sitz baths to soothe any pain or discomfort caused by hemorrhoids. Use hemorrhoid cream if your caregiver approves. °· If  you develop varicose veins, wear support hose. Elevate your feet for 15 minutes, 3-4 times a day. Limit salt in your diet. °· Avoid heavy lifting, wear low heal shoes, and practice good posture. °· Rest a lot with your legs elevated if you have leg cramps or low back pain. °· Visit your dentist if you have not gone during your pregnancy. Use a soft toothbrush to brush your teeth and be gentle when you floss. °· A sexual relationship may be continued unless your caregiver directs you otherwise. °· Do not travel far distances unless it is absolutely necessary and only with the approval of your caregiver. °· Take prenatal classes to understand, practice, and ask questions about the labor and delivery. °· Make a trial run to the hospital. °· Pack your hospital bag. °· Prepare the baby's nursery. °· Continue to go to all your prenatal visits as directed by your caregiver. °SEEK MEDICAL CARE IF: °· You are unsure if you are in labor or if your water has broken. °· You have dizziness. °· You have   mild pelvic cramps, pelvic pressure, or nagging pain in your abdominal area.  You have persistent nausea, vomiting, or diarrhea.  You have a bad smelling vaginal discharge.  You have pain with urination. SEEK IMMEDIATE MEDICAL CARE IF:   You have a fever.  You are leaking fluid from your vagina.  You have spotting or bleeding from your vagina.  You have severe abdominal cramping or pain.  You have rapid weight loss or gain.  You have shortness of breath with chest pain.  You notice sudden or extreme swelling of your face, hands, ankles, feet, or legs.  You have not felt your baby move in over an hour.  You have severe headaches that do not go away with medicine.  You have vision changes.   This information is not intended to replace advice given to you by your health care provider. Make sure you discuss any questions you have with your health care provider.   Document Released: 09/15/2001 Document  Revised: 10/12/2014 Document Reviewed: 11/22/2012 Elsevier Interactive Patient Education 2016 [place]. Fetal Movement Counts Patient Name: __________________________________________________ Patient Due Date: ____________________ Performing a fetal movement count is highly recommended in high-risk pregnancies, but it is good for every pregnant woman to do. Your health care provider may ask you to start counting fetal movements at 28 weeks of the pregnancy. Fetal movements often increase: After eating a full meal. After physical activity. After eating or drinking something sweet or cold. At rest. Pay attention to when you feel the baby is most active. This will help you notice a pattern of your baby's sleep and wake cycles and what factors contribute to an increase in fetal movement. It is important to perform a fetal movement count at the same time each day when your baby is normally most active.  HOW TO COUNT FETAL MOVEMENTS Find a quiet and comfortable area to sit or lie down on your left side. Lying on your left side provides the best blood and oxygen circulation to your baby. Write down the day and time on a sheet of paper or in a journal. Start counting kicks, flutters, swishes, rolls, or jabs in a 2-hour period. You should feel at least 10 movements within 2 hours. If you do not feel 10 movements in 2 hours, wait 2-3 hours and count again. Look for a change in the pattern or not enough counts in 2 hours. SEEK MEDICAL CARE IF: You feel less than 10 counts in 2 hours, tried twice. There is no movement in over an hour. The pattern is changing or taking longer each day to reach 10 counts in 2 hours. You feel the baby is not moving as he or she usually does. Date: ____________ Movements: ____________ Start time: ____________ Chelsea Dorsey time: ____________  Date: ____________ Movements: ____________ Start time: ____________ Chelsea Dorsey time: ____________ Date: ____________ Movements: ____________  Start time: ____________ Chelsea Dorsey time: ____________ Date: ____________ Movements: ____________ Start time: ____________ Chelsea Dorsey time: ____________ Date: ____________ Movements: ____________ Start time: ____________ Chelsea Dorsey time: ____________ Date: ____________ Movements: ____________ Start time: ____________ Chelsea Dorsey time: ____________ Date: ____________ Movements: ____________ Start time: ____________ Chelsea Dorsey time: ____________ Date: ____________ Movements: ____________ Start time: ____________ Chelsea Dorsey time: ____________  Date: ____________ Movements: ____________ Start time: ____________ Chelsea Dorsey time: ____________ Date: ____________ Movements: ____________ Start time: ____________ Chelsea Dorsey time: ____________ Date: ____________ Movements: ____________ Start time: ____________ Chelsea Dorsey time: ____________ Date: ____________ Movements: ____________ Start time: ____________ Chelsea Dorsey time: ____________ Date: ____________ Movements: ____________ Start time: ____________ Chelsea Dorsey time: ____________ Date: ____________ Movements:  ____________ Start time: ____________ Chelsea Dorsey time: ____________ Date: ____________ Movements: ____________ Start time: ____________ Chelsea Dorsey time: ____________  Date: ____________ Movements: ____________ Start time: ____________ Chelsea Dorsey time: ____________ Date: ____________ Movements: ____________ Start time: ____________ Chelsea Dorsey time: ____________ Date: ____________ Movements: ____________ Start time: ____________ Chelsea Dorsey time: ____________ Date: ____________ Movements: ____________ Start time: ____________ Chelsea Dorsey time: ____________ Date: ____________ Movements: ____________ Start time: ____________ Chelsea Dorsey time: ____________ Date: ____________ Movements: ____________ Start time: ____________ Chelsea Dorsey time: ____________ Date: ____________ Movements: ____________ Start time: ____________ Chelsea Dorsey time: ____________  Date: ____________ Movements: ____________ Start time: ____________ Chelsea Dorsey time:  ____________ Date: ____________ Movements: ____________ Start time: ____________ Chelsea Dorsey time: ____________ Date: ____________ Movements: ____________ Start time: ____________ Chelsea Dorsey time: ____________ Date: ____________ Movements: ____________ Start time: ____________ Chelsea Dorsey time: ____________ Date: ____________ Movements: ____________ Start time: ____________ Chelsea Dorsey time: ____________ Date: ____________ Movements: ____________ Start time: ____________ Chelsea Dorsey time: ____________ Date: ____________ Movements: ____________ Start time: ____________ Chelsea Dorsey time: ____________  Date: ____________ Movements: ____________ Start time: ____________ Chelsea Dorsey time: ____________ Date: ____________ Movements: ____________ Start time: ____________ Chelsea Dorsey time: ____________ Date: ____________ Movements: ____________ Start time: ____________ Chelsea Dorsey time: ____________ Date: ____________ Movements: ____________ Start time: ____________ Chelsea Dorsey time: ____________ Date: ____________ Movements: ____________ Start time: ____________ Chelsea Dorsey time: ____________ Date: ____________ Movements: ____________ Start time: ____________ Chelsea Dorsey time: ____________ Date: ____________ Movements: ____________ Start time: ____________ Chelsea Dorsey time: ____________  Date: ____________ Movements: ____________ Start time: ____________ Chelsea Dorsey time: ____________ Date: ____________ Movements: ____________ Start time: ____________ Chelsea Dorsey time: ____________ Date: ____________ Movements: ____________ Start time: ____________ Chelsea Dorsey time: ____________ Date: ____________ Movements: ____________ Start time: ____________ Chelsea Dorsey time: ____________ Date: ____________ Movements: ____________ Start time: ____________ Chelsea Dorsey time: ____________ Date: ____________ Movements: ____________ Start time: ____________ Chelsea Dorsey time: ____________ Date: ____________ Movements: ____________ Start time: ____________ Chelsea Dorsey time: ____________  Date: ____________ Movements:  ____________ Start time: ____________ Chelsea Dorsey time: ____________ Date: ____________ Movements: ____________ Start time: ____________ Chelsea Dorsey time: ____________ Date: ____________ Movements: ____________ Start time: ____________ Chelsea Dorsey time: ____________ Date: ____________ Movements: ____________ Start time: ____________ Chelsea Dorsey time: ____________ Date: ____________ Movements: ____________ Start time: ____________ Chelsea Dorsey time: ____________ Date: ____________ Movements: ____________ Start time: ____________ Chelsea Dorsey time: ____________ Date: ____________ Movements: ____________ Start time: ____________ Chelsea Dorsey time: ____________  Date: ____________ Movements: ____________ Start time: ____________ Chelsea Dorsey time: ____________ Date: ____________ Movements: ____________ Start time: ____________ Chelsea Dorsey time: ____________ Date: ____________ Movements: ____________ Start time: ____________ Chelsea Dorsey time: ____________ Date: ____________ Movements: ____________ Start time: ____________ Chelsea Dorsey time: ____________ Date: ____________ Movements: ____________ Start time: ____________ Chelsea Dorsey time: ____________ Date: ____________ Movements: ____________ Start time: ____________ Chelsea Dorsey time: ____________   This information is not intended to replace advice given to you by your health care provider. Make sure you discuss any questions you have with your health care provider.   Document Released: 10/21/2006 Document Revised: 10/12/2014 Document Reviewed: 07/18/2012 Elsevier Interactive Patient Education [employer].

## 2015-07-16 ENCOUNTER — Encounter: Payer: Self-pay | Admitting: Women's Health

## 2015-07-16 ENCOUNTER — Ambulatory Visit (INDEPENDENT_AMBULATORY_CARE_PROVIDER_SITE_OTHER): Payer: Medicaid Other | Admitting: Women's Health

## 2015-07-16 VITALS — BP 100/60 | HR 104 | Wt 238.0 lb

## 2015-07-16 DIAGNOSIS — Z3403 Encounter for supervision of normal first pregnancy, third trimester: Secondary | ICD-10-CM

## 2015-07-16 DIAGNOSIS — Z1389 Encounter for screening for other disorder: Secondary | ICD-10-CM

## 2015-07-16 DIAGNOSIS — Z331 Pregnant state, incidental: Secondary | ICD-10-CM

## 2015-07-16 DIAGNOSIS — F1290 Cannabis use, unspecified, uncomplicated: Secondary | ICD-10-CM

## 2015-07-16 LAB — POCT URINALYSIS DIPSTICK
Blood, UA: NEGATIVE
GLUCOSE UA: NEGATIVE
Ketones, UA: NEGATIVE
Leukocytes, UA: NEGATIVE
NITRITE UA: NEGATIVE

## 2015-07-16 NOTE — Progress Notes (Signed)
Low-risk OB appointment G1P0 [redacted]w[redacted]d Estimated Date of Delivery: 08/11/15 BP 100/60 mmHg  Pulse 104  Wt 238 lb (107.956 kg)  LMP 10/25/2014 (Approximate)  BP, weight, and urine reviewed.  Refer to obstetrical flow sheet for FH & FHR.  Reports good fm.  Denies regular uc's, lof, vb, or uti s/s. No complaints. Went to [facility] the other day w/ leaking, was just d/c.  Reviewed ptl s/s, fkc. Plan:  Continue routine obstetrical care  F/U in 1wk for OB appointment and gbs  +THC earlier in pregnancy, denies any use since then, will repeat today

## 2015-07-16 NOTE — Progress Notes (Signed)
Pt denies any problems or concerns at this time.  

## 2015-07-16 NOTE — Patient Instructions (Signed)
Call the office ([phone]) or go to [facility] if:  You begin to have strong, frequent contractions  Your water breaks.  Sometimes it is a big gush of fluid, sometimes it is just a trickle that keeps getting your panties wet or running down your legs  You have vaginal bleeding.  It is normal to have a small amount of spotting if your cervix was checked.   You don't feel your baby moving like normal.  If you don't, get you something to eat and drink and lay down and focus on feeling your baby move.  You should feel at least 10 movements in 2 hours.  If you don't, you should call the office or go to [facility].    Braxton Hicks Contractions Contractions of the uterus can occur throughout pregnancy. Contractions are not always a sign that you are in labor.  WHAT ARE BRAXTON HICKS CONTRACTIONS?  Contractions that occur before labor are called Braxton Hicks contractions, or false labor. Toward the end of pregnancy (32-34 weeks), these contractions can develop more often and may become more forceful. This is not true labor because these contractions do not result in opening (dilatation) and thinning of the cervix. They are sometimes difficult to tell apart from true labor because these contractions can be forceful and people have different pain tolerances. You should not feel embarrassed if you go to the hospital with false labor. Sometimes, the only way to tell if you are in true labor is for your health care provider to look for changes in the cervix. If there are no prenatal problems or other health problems associated with the pregnancy, it is completely safe to be sent home with false labor and await the onset of true labor. HOW CAN YOU TELL THE DIFFERENCE BETWEEN TRUE AND FALSE LABOR? False Labor  The contractions of false labor are usually shorter and not as hard as those of true labor.   The contractions are usually irregular.   The contractions are often felt in the front of  the lower abdomen and in the groin.   The contractions may go away when you walk around or change positions while lying down.   The contractions get weaker and are shorter lasting as time goes on.   The contractions do not usually become progressively stronger, regular, and closer together as with true labor.  True Labor  Contractions in true labor last 30-70 seconds, become very regular, usually become more intense, and increase in frequency.   The contractions do not go away with walking.   The discomfort is usually felt in the top of the uterus and spreads to the lower abdomen and low back.   True labor can be determined by your health care provider with an exam. This will show that the cervix is dilating and getting thinner.  WHAT TO REMEMBER  Keep up with your usual exercises and follow other instructions given by your health care provider.   Take medicines as directed by your health care provider.   Keep your regular prenatal appointments.   Eat and drink lightly if you think you are going into labor.   If Braxton Hicks contractions are making you uncomfortable:   Change your position from lying down or resting to walking, or from walking to resting.   Sit and rest in a tub of warm water.   Drink 2-3 glasses of water. Dehydration may cause these contractions.   Do slow and deep breathing several times an hour.    WHEN SHOULD I SEEK IMMEDIATE MEDICAL CARE? Seek immediate medical care if:  Your contractions become stronger, more regular, and closer together.   You have fluid leaking or gushing from your vagina.   You have a fever.   You pass blood-tinged mucus.   You have vaginal bleeding.   You have continuous abdominal pain.   You have low back pain that you never had before.   You feel your baby's head pushing down and causing pelvic pressure.   Your baby is not moving as much as it used to.    This information is not intended to  replace advice given to you by your health care provider. Make sure you discuss any questions you have with your health care provider.   Document Released: 09/21/2005 Document Revised: 09/26/2013 Document Reviewed: 07/03/2013 Elsevier Interactive Patient Education [employer].

## 2015-07-17 LAB — PMP SCREEN PROFILE (10S), URINE
Amphetamine Screen, Ur: NEGATIVE ng/mL
BENZODIAZEPINE SCREEN, URINE: NEGATIVE ng/mL
Barbiturate Screen, Ur: NEGATIVE ng/mL
CREATININE(CRT), U: 150.7 mg/dL (ref 20.0–300.0)
Cannabinoids Ur Ql Scn: POSITIVE ng/mL
Cocaine(Metab.)Screen, Urine: NEGATIVE ng/mL
METHADONE SCREEN, URINE: NEGATIVE ng/mL
OPIATE SCRN UR: NEGATIVE ng/mL
OXYCODONE+OXYMORPHONE UR QL SCN: NEGATIVE ng/mL
PCP SCRN UR: NEGATIVE ng/mL
PH UR, DRUG SCRN: 6.7 (ref 4.5–8.9)
PROPOXYPHENE SCREEN: NEGATIVE ng/mL

## 2015-07-23 ENCOUNTER — Ambulatory Visit (INDEPENDENT_AMBULATORY_CARE_PROVIDER_SITE_OTHER): Payer: Medicaid Other | Admitting: Advanced Practice Midwife

## 2015-07-23 ENCOUNTER — Encounter: Payer: Self-pay | Admitting: Advanced Practice Midwife

## 2015-07-23 VITALS — BP 120/70 | HR 104 | Wt 239.0 lb

## 2015-07-23 DIAGNOSIS — D259 Leiomyoma of uterus, unspecified: Secondary | ICD-10-CM

## 2015-07-23 DIAGNOSIS — Z3403 Encounter for supervision of normal first pregnancy, third trimester: Secondary | ICD-10-CM

## 2015-07-23 DIAGNOSIS — O3410 Maternal care for benign tumor of corpus uteri, unspecified trimester: Secondary | ICD-10-CM

## 2015-07-23 DIAGNOSIS — Z331 Pregnant state, incidental: Secondary | ICD-10-CM

## 2015-07-23 DIAGNOSIS — Z1389 Encounter for screening for other disorder: Secondary | ICD-10-CM

## 2015-07-23 DIAGNOSIS — Z369 Encounter for antenatal screening, unspecified: Secondary | ICD-10-CM

## 2015-07-23 DIAGNOSIS — O3411 Maternal care for benign tumor of corpus uteri, first trimester: Secondary | ICD-10-CM

## 2015-07-23 LAB — POCT URINALYSIS DIPSTICK
Blood, UA: NEGATIVE
GLUCOSE UA: NEGATIVE
KETONES UA: NEGATIVE
Leukocytes, UA: NEGATIVE
Nitrite, UA: NEGATIVE

## 2015-07-23 LAB — OB RESULTS CONSOLE GBS: STREP GROUP B AG: POSITIVE

## 2015-07-23 NOTE — Progress Notes (Signed)
Pt denies any problems or concerns at this time.  

## 2015-07-23 NOTE — Progress Notes (Signed)
G1P0 [redacted]w[redacted]d Estimated Date of Delivery: 08/11/15  Blood pressure 120/70, pulse 104, weight 239 lb (108.41 kg), last menstrual period 10/25/2014.   BP weight and urine results all reviewed and noted.  Please refer to the obstetrical flow sheet for the fundal height and fetal heart rate documentation:  Patient reports good fetal movement, denies any bleeding and no rupture of membranes symptoms or regular contractions. Patient is without complaints. All questions were answered.  Orders Placed This Encounter  Procedures  . Culture, beta strep (group b only)  . GC/Chlamydia Probe Amp  . POCT urinalysis dipstick    Plan:  Continued routine obstetrical care, GBS today  Return in about 1 week (around 07/30/2015) for LROB.

## 2015-07-25 LAB — GC/CHLAMYDIA PROBE AMP
Chlamydia trachomatis, NAA: NEGATIVE
Neisseria gonorrhoeae by PCR: NEGATIVE

## 2015-07-27 LAB — CULTURE, BETA STREP (GROUP B ONLY): Strep Gp B Culture: POSITIVE — AB

## 2015-07-29 ENCOUNTER — Inpatient Hospital Stay (HOSPITAL_COMMUNITY)
Admission: AD | Admit: 2015-07-29 | Discharge: 2015-07-29 | Disposition: A | Discharge status: Home / Self Care | Payer: Medicaid Other | Source: Ambulatory Visit | Attending: Obstetrics and Gynecology | Admitting: Obstetrics and Gynecology

## 2015-07-29 ENCOUNTER — Encounter (HOSPITAL_COMMUNITY): Payer: Self-pay

## 2015-07-29 DIAGNOSIS — O26893 Other specified pregnancy related conditions, third trimester: Secondary | ICD-10-CM | POA: Insufficient documentation

## 2015-07-29 DIAGNOSIS — Z880 Allergy status to penicillin: Secondary | ICD-10-CM | POA: Diagnosis not present

## 2015-07-29 DIAGNOSIS — Z3A38 38 weeks gestation of pregnancy: Secondary | ICD-10-CM | POA: Diagnosis not present

## 2015-07-29 DIAGNOSIS — R05 Cough: Secondary | ICD-10-CM | POA: Diagnosis not present

## 2015-07-29 DIAGNOSIS — M545 Low back pain, unspecified: Secondary | ICD-10-CM

## 2015-07-29 NOTE — Discharge Instructions (Signed)
Ms. Evelena Leyden, your back and abdominal pain is most likely due to baby's movement as you progress in your pregnancy. It is possible that walking and sexual activity made her more active.  Your baby looks healthy on fetal heart tracing, and your vital signs are stable. For pain, we recommend tylenol.  If you experience frequent pain that comes and goes, have loss of fluid, vaginal bleeding or pain with urination please seek medical attention. Please return if you do not feel your baby moving every 4 hours.

## 2015-07-29 NOTE — MAU Provider Note (Signed)
History   CSN: [id number]  Arrival date and time: 07/29/15 0205   First Provider Initiated Contact with Patient 07/29/15 0300      No chief complaint on file.  HPI  [name] is a 24 yo G1P0 at [redacted]w[redacted]d who presents with low back pain since 10 pm. It began when she and her husband were walking through [place]. She states it "felt like my kidneys were being twisted." It is constant, and she rates the pain as a 5 out of 10. Pain is worsened by fetal movement, and baby has been moving more often than normal tonight. Patient did have vaginal intercourse around 7 pm. The back pain is associated with mild lower abdominal pain. She has not taken anything for pain. She denies loss of fluid or VB. She denies HA, dysuria, n/v/d. She has had an occasional cough over the past 3 days. She was concerned that she was having contractions.    OB History    Gravida Para Term Preterm AB TAB SAB Ectopic Multiple Living   1               Past Medical History  Diagnosis Date  . UTI (lower urinary tract infection)   . Pregnant 12/10/2014    Past Surgical History  Procedure Laterality Date  . No past surgeries      History reviewed. No pertinent family history.  Social History  Substance Use Topics  . Smoking status: Current Some Day Smoker -- 0.50 packs/day for 5 years    Types: Cigarettes  . Smokeless tobacco: Never Used  . Alcohol Use: No    Allergies:  Allergies  Allergen Reactions  . Penicillins Hives    Has patient had a PCN reaction causing immediate rash, facial/tongue/throat swelling, SOB or lightheadedness with hypotension: No Has patient had a PCN reaction causing severe rash involving mucus membranes or skin necrosis: No Has patient had a PCN reaction that required hospitalization No Has patient had a PCN reaction occurring within the last 10 years: No If all of the above answers are "NO", then may proceed with Cephalosporin use.    Prescriptions prior to admission  Medication  Sig Dispense Refill Last Dose  . Prenatal Vit-Fe Fumarate-FA (PRENATAL VITAMIN PO) Take by mouth daily.   07/28/2015 at Unknown time   Review of Systems  Constitutional: Negative for fever and chills.  Eyes: Negative for blurred vision.  Respiratory: Positive for cough (past 3 days).   Cardiovascular: Negative for chest pain.  Gastrointestinal: Positive for abdominal pain. Negative for nausea and vomiting.  Genitourinary: Negative for dysuria.  Musculoskeletal: Positive for back pain.  Neurological: Negative for dizziness and headaches.   Physical Exam   Blood pressure 134/83, pulse 96, temperature 97.9 F (36.6 C), temperature source Oral, resp. rate 18, height 5\' 2"  (1.575 m), weight 110.95 kg (244 lb 9.6 oz), last menstrual period 10/25/2014, SpO2 97 %.  Physical Exam  Constitutional: She is oriented to person, place, and time. She appears well-developed and well-nourished. No distress.  HENT:  Head: Normocephalic and atraumatic.  Cardiovascular: Normal rate, regular rhythm, normal heart sounds and intact distal pulses.  Exam reveals no gallop and no friction rub.   No murmur heard. Respiratory: Effort normal and breath sounds normal. No respiratory distress.  GI: She exhibits mass (gravid). There is no tenderness.  Musculoskeletal: She exhibits no edema.  Neurological: She is alert and oriented to person, place, and time.  Skin: Skin is warm and dry.  Psychiatric: She  has a normal mood and affect. Her behavior is normal.  GU: Cervix dilated to 0.5, thick and -3 station.   MAU Course  Procedures  MDM  Monitoring with FHT and toco for approximately an hour showed a reactive strip and no contractions.   Assessment and Plan  Patient presents with new onset back pain for the past 5 hours. She is afebrile and has no CVA tenderness or pain to palpation on exam, making infection unlikely. FHT is reactive, and patient is feeling frequent movement. Pain is brought on by fetal  movement. She does not want narcotic pain medication and states she will try tylenol. Patient is not having contractions, and cervix is minimally dilated, ruling out active labor. Suspect pain is secondary to increased fetal movement, which may have been brought on by increased physical activity (walking) or recent sexual intercourse. She has close follow-up with a prenatal appointment tomorrow, 07/30/15. Blood pressures fluctuated during visit, likely due to discomfort.   -Discharged patient home -Provided labor return precautions -Provided information on preeclampsia (given a couple of high BP readings despite lack of other symptoms)  Future Appointments Date Time Provider [place]  07/30/2015 11:15 AM Roma Schanz, CNM [facility]    Darci Needle, MD [place], PGY-1 07/29/2015, 3:15 AM

## 2015-07-29 NOTE — MAU Note (Signed)
Per Dr Ola Spurr, after reviewing final BP, patient may be discharged home. Pt denies HA, vision changes, pain, etc-pt has [facility] appointment tomorrow-will follow up there. Pre-eclampsia handout given to patient with strict precaution on when to return.

## 2015-07-29 NOTE — MAU Note (Signed)
Dr aware of pt BP. RN can recheck BP and if WNL pt may be discharged home.

## 2015-07-29 NOTE — MAU Note (Signed)
Lower back pain and stomach pain x3 hours. Feels like contractions. Denies LOF or vag bleeding. +FM. Cervix closed last week

## 2015-07-30 ENCOUNTER — Encounter: Payer: Self-pay | Admitting: Women's Health

## 2015-07-30 ENCOUNTER — Ambulatory Visit (INDEPENDENT_AMBULATORY_CARE_PROVIDER_SITE_OTHER): Payer: Medicaid Other | Admitting: Women's Health

## 2015-07-30 VITALS — BP 136/84 | HR 64 | Wt 244.0 lb

## 2015-07-30 DIAGNOSIS — Z3403 Encounter for supervision of normal first pregnancy, third trimester: Secondary | ICD-10-CM

## 2015-07-30 DIAGNOSIS — Z331 Pregnant state, incidental: Secondary | ICD-10-CM

## 2015-07-30 DIAGNOSIS — Z1389 Encounter for screening for other disorder: Secondary | ICD-10-CM

## 2015-07-30 LAB — POCT URINALYSIS DIPSTICK
GLUCOSE UA: NEGATIVE
KETONES UA: NEGATIVE
Leukocytes, UA: NEGATIVE
NITRITE UA: NEGATIVE
Protein, UA: NEGATIVE
RBC UA: NEGATIVE

## 2015-07-30 NOTE — Patient Instructions (Signed)
Call the office [phone]) or go to [facility] if:  You begin to have strong, frequent contractions  Your water breaks.  Sometimes it is a big gush of fluid, sometimes it is just a trickle that keeps getting your panties wet or running down your legs  You have vaginal bleeding.  It is normal to have a small amount of spotting if your cervix was checked.   You don't feel your baby moving like normal.  If you don't, get you something to eat and drink and lay down and focus on feeling your baby move.  You should feel at least 10 movements in 2 hours.  If you don't, you should call the office or go to [facility].    Call the office [phone]) or go to [facility] hospital for these signs of pre-eclampsia:  Severe headache that does not go away with Tylenol  Visual changes- seeing spots, double, blurred vision  Pain under your right breast or upper abdomen that does not go away with Tums or heartburn medicine  Nausea and/or vomiting  Severe swelling in your hands, feet, and face    Braxton Hicks Contractions Contractions of the uterus can occur throughout pregnancy. Contractions are not always a sign that you are in labor.  WHAT ARE BRAXTON HICKS CONTRACTIONS?  Contractions that occur before labor are called Braxton Hicks contractions, or false labor. Toward the end of pregnancy (32-34 weeks), these contractions can develop more often and may become more forceful. This is not true labor because these contractions do not result in opening (dilatation) and thinning of the cervix. They are sometimes difficult to tell apart from true labor because these contractions can be forceful and people have different pain tolerances. You should not feel embarrassed if you go to the hospital with false labor. Sometimes, the only way to tell if you are in true labor is for your health care provider to look for changes in the cervix. If there are no prenatal problems or other health problems associated  with the pregnancy, it is completely safe to be sent home with false labor and await the onset of true labor. HOW CAN YOU TELL THE DIFFERENCE BETWEEN TRUE AND FALSE LABOR? False Labor  The contractions of false labor are usually shorter and not as hard as those of true labor.   The contractions are usually irregular.   The contractions are often felt in the front of the lower abdomen and in the groin.   The contractions may go away when you walk around or change positions while lying down.   The contractions get weaker and are shorter lasting as time goes on.   The contractions do not usually become progressively stronger, regular, and closer together as with true labor.  True Labor  Contractions in true labor last 30-70 seconds, become very regular, usually become more intense, and increase in frequency.   The contractions do not go away with walking.   The discomfort is usually felt in the top of the uterus and spreads to the lower abdomen and low back.   True labor can be determined by your health care provider with an exam. This will show that the cervix is dilating and getting thinner.  WHAT TO REMEMBER  Keep up with your usual exercises and follow other instructions given by your health care provider.   Take medicines as directed by your health care provider.   Keep your regular prenatal appointments.   Eat and drink lightly if you  think you are going into labor.   If Braxton Hicks contractions are making you uncomfortable:   Change your position from lying down or resting to walking, or from walking to resting.   Sit and rest in a tub of warm water.   Drink 2-3 glasses of water. Dehydration may cause these contractions.   Do slow and deep breathing several times an hour.  WHEN SHOULD I SEEK IMMEDIATE MEDICAL CARE? Seek immediate medical care if:  Your contractions become stronger, more regular, and closer together.   You have fluid leaking or  gushing from your vagina.   You have a fever.   You pass blood-tinged mucus.   You have vaginal bleeding.   You have continuous abdominal pain.   You have low back pain that you never had before.   You feel your baby's head pushing down and causing pelvic pressure.   Your baby is not moving as much as it used to.    This information is not intended to replace advice given to you by your health care provider. Make sure you discuss any questions you have with your health care provider.   Document Released: 09/21/2005 Document Revised: 09/26/2013 Document Reviewed: 07/03/2013 Elsevier Interactive Patient Education [employer].

## 2015-07-30 NOTE — Progress Notes (Signed)
Low-risk OB appointment G1P0 [redacted]w[redacted]d Estimated Date of Delivery: 08/11/15 BP 136/84 mmHg  Pulse 64  Wt 244 lb (110.678 kg)  LMP 10/25/2014 (Approximate)  BP, weight, and urine reviewed.  Refer to obstetrical flow sheet for FH & FHR.  Reports good fm.  Denies regular uc's, lof, vb, or uti s/s. [place] [facility] this [place] for [place]- bp there normal at times, then a couple of times was 140s/90s- she thinks cuff was too small. Initial bp here today 142/80 w/ regular cuff- switched to large cuff was 136/84. Denies ha, scotomata, ruq/epigastric pain, n/v.  DTRs 2+, no clonus, trace edema.  SVE per request: 1/80/-2, vtx Reviewed labor s/s, fkc, pre-e s/s, gbs+. Plan:  Continue routine obstetrical care  F/U in 3d for OB appointment/bp check

## 2015-08-02 ENCOUNTER — Ambulatory Visit (INDEPENDENT_AMBULATORY_CARE_PROVIDER_SITE_OTHER): Payer: Medicaid Other | Admitting: Obstetrics and Gynecology

## 2015-08-02 ENCOUNTER — Encounter: Payer: Self-pay | Admitting: Obstetrics and Gynecology

## 2015-08-02 VITALS — BP 118/70 | HR 98 | Wt 243.0 lb

## 2015-08-02 DIAGNOSIS — Z1389 Encounter for screening for other disorder: Secondary | ICD-10-CM

## 2015-08-02 DIAGNOSIS — Z3403 Encounter for supervision of normal first pregnancy, third trimester: Secondary | ICD-10-CM

## 2015-08-02 DIAGNOSIS — Z331 Pregnant state, incidental: Secondary | ICD-10-CM

## 2015-08-02 LAB — POCT URINALYSIS DIPSTICK
Blood, UA: NEGATIVE
Glucose, UA: NEGATIVE
KETONES UA: NEGATIVE
LEUKOCYTES UA: NEGATIVE
NITRITE UA: NEGATIVE

## 2015-08-02 NOTE — Progress Notes (Signed)
Pt denies any problems or concerns at this time.  

## 2015-08-02 NOTE — Progress Notes (Signed)
Patient ID: Chelsea Dorsey, female   DOB: [birth year], 24 y.o.   MRN: [id number]  G1P0 [redacted]w[redacted]d Estimated Date of Delivery: 08/11/15  Blood pressure 118/70, pulse 98, weight 243 lb (110.224 kg), last menstrual period 10/25/2014.   refer to the ob flow sheet for FH and FHR, also BP, Wt, Urine results:notable for trace protein, otherwise negative   Patient reports  + good fetal movement, denies any bleeding and no rupture of membranes symptoms or regular contractions. Patient complaints: None. Patient plans on using Nexplanon for birth control postpartum. FH: 38 cm  FHR: 154 bpm Cervix: firm, closed, long  Questions were answered. Assessment: Pregnancy [redacted]w[redacted]d, LROB Plan:  Continued routine obstetrical care,  F/u in 1 weeks for pnx care    By signing my name below, I, Stephania Fragmin, attest that this documentation has been prepared under the direction and in the presence of Jonnie Kind, MD. Electronically Signed: Stephania Fragmin, ED Scribe. 08/02/2015. 1:13 PM.  I personally performed the services described in this documentation, which was SCRIBED in my presence. The recorded information has been reviewed and considered accurate. It has been edited as necessary during review. Jonnie Kind, MD  (scribe attestation statement)

## 2015-08-05 ENCOUNTER — Inpatient Hospital Stay (HOSPITAL_COMMUNITY)
Admission: AD | Admit: 2015-08-05 | Discharge: 2015-08-05 | Disposition: A | Discharge status: Home / Self Care | Payer: Medicaid Other | Source: Ambulatory Visit | Attending: Family Medicine | Admitting: Family Medicine

## 2015-08-05 ENCOUNTER — Telehealth: Payer: Self-pay | Admitting: Women's Health

## 2015-08-05 ENCOUNTER — Encounter (HOSPITAL_COMMUNITY): Payer: Self-pay

## 2015-08-05 DIAGNOSIS — F17210 Nicotine dependence, cigarettes, uncomplicated: Secondary | ICD-10-CM | POA: Insufficient documentation

## 2015-08-05 DIAGNOSIS — O99333 Smoking (tobacco) complicating pregnancy, third trimester: Secondary | ICD-10-CM | POA: Diagnosis not present

## 2015-08-05 DIAGNOSIS — O471 False labor at or after 37 completed weeks of gestation: Secondary | ICD-10-CM

## 2015-08-05 DIAGNOSIS — O26893 Other specified pregnancy related conditions, third trimester: Secondary | ICD-10-CM | POA: Insufficient documentation

## 2015-08-05 DIAGNOSIS — O99820 Streptococcus B carrier state complicating pregnancy: Secondary | ICD-10-CM | POA: Diagnosis not present

## 2015-08-05 DIAGNOSIS — Z3A39 39 weeks gestation of pregnancy: Secondary | ICD-10-CM | POA: Insufficient documentation

## 2015-08-05 DIAGNOSIS — Z3403 Encounter for supervision of normal first pregnancy, third trimester: Secondary | ICD-10-CM

## 2015-08-05 LAB — AMNISURE RUPTURE OF MEMBRANE (ROM) NOT AT ARMC: Amnisure ROM: NEGATIVE

## 2015-08-05 NOTE — MAU Note (Signed)
Pt reports fluid leaking since 4pm. Pt reports increasing contractions since noon.

## 2015-08-05 NOTE — Discharge Instructions (Signed)

## 2015-08-05 NOTE — MAU Provider Note (Signed)
Chelsea Dorsey is a 24 y.o.G1 at 39.1 wks. female presenting for SROM at 1600. States leaking clear fluid worse when standing. History OB History    Gravida Para Term Preterm AB TAB SAB Ectopic Multiple Living   1              Past Medical History  Diagnosis Date  . UTI (lower urinary tract infection)   . Pregnant 12/10/2014   Past Surgical History  Procedure Laterality Date  . No past surgeries     Family History: family history is not on file. Social History:  reports that she has been smoking Cigarettes.  She has a 2.5 pack-year smoking history. She has never used smokeless tobacco. She reports that she does not drink alcohol or use illicit drugs.   Prenatal Transfer Tool  Maternal Diabetes: No Genetic Screening: Normal Maternal Ultrasounds/Referrals: Normal Fetal Ultrasounds or other Referrals:  None Maternal Substance Abuse:  No Significant Maternal Medications:  None Significant Maternal Lab Results:  Lab values include: Group B Strep positive Other Comments:  None  Review of Systems  Constitutional: Negative.   HENT: Negative.   Eyes: Negative.   Respiratory: Negative.   Cardiovascular: Negative.   Gastrointestinal: Positive for abdominal pain.  Genitourinary: Negative.   Musculoskeletal: Positive for back pain.  Skin: Negative.   Neurological: Negative.   Endo/Heme/Allergies: Negative.   Psychiatric/Behavioral: Negative.       Last menstrual period 10/25/2014. Maternal Exam:  Uterine Assessment: Contraction strength is mild.  Contraction frequency is irregular.   Abdomen: Patient reports no abdominal tenderness. Fetal presentation: vertex  Introitus: Normal vulva. Normal vagina.  Pelvis: adequate for delivery.   Cervix: Cervix evaluated by digital exam.     Fetal Exam Fetal Monitor Review: Mode: ultrasound.   Variability: moderate (6-25 bpm).    Fetal State Assessment: Category I - tracings are normal.     Physical Exam  Constitutional: She is  oriented to person, place, and time. She appears well-developed and well-nourished.  HENT:  Head: Normocephalic.  Eyes: Pupils are equal, round, and reactive to light.  Neck: Normal range of motion.  Cardiovascular: Normal rate, regular rhythm, normal heart sounds and intact distal pulses.   Respiratory: Effort normal and breath sounds normal.  GI: Soft. Bowel sounds are normal.  Genitourinary: Vagina normal and uterus normal.  Musculoskeletal: Normal range of motion.  Neurological: She is alert and oriented to person, place, and time. She has normal reflexes.  Skin: Skin is warm and dry.  Psychiatric: She has a normal mood and affect. Her behavior is normal. Judgment and thought content normal.    Prenatal labs: ABO, Rh: O/Positive/-- (03/22 1130) Antibody: Negative (08/09 0912) Rubella: 2.27 (03/22 1130) RPR: Non Reactive (08/09 0912)  HBsAg: Negative (03/22 1130)  HIV: Non Reactive (08/09 0912)  GBS: Positive (10/18 0000)   Assessment/Plan: amnisure neg will d/c home. SVE ft/th/post/blts. No active leaking on admission.   Koren Shiver DARLENE 08/05/2015, 5:27 PM

## 2015-08-05 NOTE — Telephone Encounter (Signed)
Before I could call the pt back she called stating that thought her water just broke. Pt was advised to go to [facility] for evaluation.

## 2015-08-06 ENCOUNTER — Encounter: Payer: Self-pay | Admitting: Women's Health

## 2015-08-06 ENCOUNTER — Ambulatory Visit (INDEPENDENT_AMBULATORY_CARE_PROVIDER_SITE_OTHER): Payer: Medicaid Other | Admitting: Women's Health

## 2015-08-06 VITALS — BP 108/64 | HR 96 | Wt 244.0 lb

## 2015-08-06 DIAGNOSIS — Z3A39 39 weeks gestation of pregnancy: Secondary | ICD-10-CM | POA: Diagnosis not present

## 2015-08-06 DIAGNOSIS — O368130 Decreased fetal movements, third trimester, not applicable or unspecified: Secondary | ICD-10-CM | POA: Diagnosis not present

## 2015-08-06 DIAGNOSIS — Z3685 Encounter for antenatal screening for Streptococcus B: Secondary | ICD-10-CM

## 2015-08-06 DIAGNOSIS — Z331 Pregnant state, incidental: Secondary | ICD-10-CM

## 2015-08-06 DIAGNOSIS — Z1389 Encounter for screening for other disorder: Secondary | ICD-10-CM

## 2015-08-06 DIAGNOSIS — Z3403 Encounter for supervision of normal first pregnancy, third trimester: Secondary | ICD-10-CM | POA: Diagnosis not present

## 2015-08-06 LAB — POCT URINALYSIS DIPSTICK
GLUCOSE UA: NEGATIVE
Ketones, UA: NEGATIVE
LEUKOCYTES UA: NEGATIVE
NITRITE UA: NEGATIVE
RBC UA: NEGATIVE

## 2015-08-06 NOTE — Progress Notes (Signed)
Low-risk OB appointment G1P0 [redacted]w[redacted]d Estimated Date of Delivery: 08/11/15 BP 108/64 mmHg  Pulse 96  Wt 244 lb (110.678 kg)  LMP 10/25/2014 (Approximate)  BP, weight, and urine reviewed.  Refer to obstetrical flow sheet for FH & FHR.  Reports decreased fm today.  Denies regular uc's, lof, vb, or uti s/s. No complaints. SVE per request: 1/80/-2, vtx, wants membranes stripped- discussed risks/benefits- wants to proceed so membranes stripped NST reactive w/ +scalp stim Reviewed labor s/s, fkc. Plan:  Continue routine obstetrical care  F/U in 1wk for OB appointment

## 2015-08-06 NOTE — Patient Instructions (Signed)
Call the office ([phone]) or go to [facility] if:  You begin to have strong, frequent contractions  Your water breaks.  Sometimes it is a big gush of fluid, sometimes it is just a trickle that keeps getting your panties wet or running down your legs  You have vaginal bleeding.  It is normal to have a small amount of spotting if your cervix was checked.   You don't feel your baby moving like normal.  If you don't, get you something to eat and drink and lay down and focus on feeling your baby move.  You should feel at least 10 movements in 2 hours.  If you don't, you should call the office or go to [facility].    Braxton Hicks Contractions Contractions of the uterus can occur throughout pregnancy. Contractions are not always a sign that you are in labor.  WHAT ARE BRAXTON HICKS CONTRACTIONS?  Contractions that occur before labor are called Braxton Hicks contractions, or false labor. Toward the end of pregnancy (32-34 weeks), these contractions can develop more often and may become more forceful. This is not true labor because these contractions do not result in opening (dilatation) and thinning of the cervix. They are sometimes difficult to tell apart from true labor because these contractions can be forceful and people have different pain tolerances. You should not feel embarrassed if you go to the hospital with false labor. Sometimes, the only way to tell if you are in true labor is for your health care provider to look for changes in the cervix. If there are no prenatal problems or other health problems associated with the pregnancy, it is completely safe to be sent home with false labor and await the onset of true labor. HOW CAN YOU TELL THE DIFFERENCE BETWEEN TRUE AND FALSE LABOR? False Labor  The contractions of false labor are usually shorter and not as hard as those of true labor.   The contractions are usually irregular.   The contractions are often felt in the front of  the lower abdomen and in the groin.   The contractions may go away when you walk around or change positions while lying down.   The contractions get weaker and are shorter lasting as time goes on.   The contractions do not usually become progressively stronger, regular, and closer together as with true labor.  True Labor  Contractions in true labor last 30-70 seconds, become very regular, usually become more intense, and increase in frequency.   The contractions do not go away with walking.   The discomfort is usually felt in the top of the uterus and spreads to the lower abdomen and low back.   True labor can be determined by your health care provider with an exam. This will show that the cervix is dilating and getting thinner.  WHAT TO REMEMBER  Keep up with your usual exercises and follow other instructions given by your health care provider.   Take medicines as directed by your health care provider.   Keep your regular prenatal appointments.   Eat and drink lightly if you think you are going into labor.   If Braxton Hicks contractions are making you uncomfortable:   Change your position from lying down or resting to walking, or from walking to resting.   Sit and rest in a tub of warm water.   Drink 2-3 glasses of water. Dehydration may cause these contractions.   Do slow and deep breathing several times an hour.    WHEN SHOULD I SEEK IMMEDIATE MEDICAL CARE? Seek immediate medical care if:  Your contractions become stronger, more regular, and closer together.   You have fluid leaking or gushing from your vagina.   You have a fever.   You pass blood-tinged mucus.   You have vaginal bleeding.   You have continuous abdominal pain.   You have low back pain that you never had before.   You feel your baby's head pushing down and causing pelvic pressure.   Your baby is not moving as much as it used to.    This information is not intended to  replace advice given to you by your health care provider. Make sure you discuss any questions you have with your health care provider.   Document Released: 09/21/2005 Document Revised: 09/26/2013 Document Reviewed: 07/03/2013 Elsevier Interactive Patient Education [employer].

## 2015-08-08 ENCOUNTER — Inpatient Hospital Stay (HOSPITAL_COMMUNITY): Payer: Medicaid Other | Admitting: Anesthesiology

## 2015-08-08 ENCOUNTER — Encounter (HOSPITAL_COMMUNITY): Payer: Self-pay | Admitting: *Deleted

## 2015-08-08 ENCOUNTER — Telehealth: Payer: Self-pay | Admitting: Obstetrics & Gynecology

## 2015-08-08 ENCOUNTER — Inpatient Hospital Stay (HOSPITAL_COMMUNITY)
Admission: AD | Admit: 2015-08-08 | Discharge: 2015-08-10 | DRG: 775 | Disposition: A | Discharge status: Home / Self Care | Payer: Medicaid Other | Source: Ambulatory Visit | Attending: Obstetrics & Gynecology | Admitting: Obstetrics & Gynecology

## 2015-08-08 DIAGNOSIS — IMO0001 Reserved for inherently not codable concepts without codable children: Secondary | ICD-10-CM

## 2015-08-08 DIAGNOSIS — Z6841 Body Mass Index (BMI) 40.0 and over, adult: Secondary | ICD-10-CM

## 2015-08-08 DIAGNOSIS — O99214 Obesity complicating childbirth: Principal | ICD-10-CM | POA: Diagnosis present

## 2015-08-08 DIAGNOSIS — Z3400 Encounter for supervision of normal first pregnancy, unspecified trimester: Secondary | ICD-10-CM

## 2015-08-08 DIAGNOSIS — Z3A39 39 weeks gestation of pregnancy: Secondary | ICD-10-CM

## 2015-08-08 DIAGNOSIS — O418X90 Other specified disorders of amniotic fluid and membranes, unspecified trimester, not applicable or unspecified: Secondary | ICD-10-CM | POA: Diagnosis present

## 2015-08-08 DIAGNOSIS — O99334 Smoking (tobacco) complicating childbirth: Secondary | ICD-10-CM | POA: Diagnosis present

## 2015-08-08 DIAGNOSIS — Z3403 Encounter for supervision of normal first pregnancy, third trimester: Secondary | ICD-10-CM

## 2015-08-08 DIAGNOSIS — F17210 Nicotine dependence, cigarettes, uncomplicated: Secondary | ICD-10-CM | POA: Diagnosis present

## 2015-08-08 DIAGNOSIS — O468X9 Other antepartum hemorrhage, unspecified trimester: Secondary | ICD-10-CM

## 2015-08-08 HISTORY — DX: Other specified health status: Z78.9

## 2015-08-08 LAB — CBC WITH DIFFERENTIAL/PLATELET
Basophils Absolute: 0 10*3/uL (ref 0.0–0.1)
Basophils Relative: 0 %
Eosinophils Absolute: 0.1 10*3/uL (ref 0.0–0.7)
Eosinophils Relative: 0 %
HCT: 43.9 % (ref 36.0–46.0)
Hemoglobin: 15.6 g/dL — ABNORMAL HIGH (ref 12.0–15.0)
Lymphocytes Relative: 13 %
Lymphs Abs: 2.3 10*3/uL (ref 0.7–4.0)
MCH: 33.2 pg (ref 26.0–34.0)
MCHC: 35.5 g/dL (ref 30.0–36.0)
MCV: 93.4 fL (ref 78.0–100.0)
Monocytes Absolute: 0.9 10*3/uL (ref 0.1–1.0)
Monocytes Relative: 5 %
Neutro Abs: 15.2 10*3/uL — ABNORMAL HIGH (ref 1.7–7.7)
Neutrophils Relative %: 82 %
Platelets: 162 10*3/uL (ref 150–400)
RBC: 4.7 MIL/uL (ref 3.87–5.11)
RDW: 13.8 % (ref 11.5–15.5)
WBC: 18.5 10*3/uL — ABNORMAL HIGH (ref 4.0–10.5)

## 2015-08-08 LAB — COMPREHENSIVE METABOLIC PANEL WITH GFR
ALT: 14 U/L (ref 14–54)
AST: 16 U/L (ref 15–41)
Albumin: 3.3 g/dL — ABNORMAL LOW (ref 3.5–5.0)
Alkaline Phosphatase: 186 U/L — ABNORMAL HIGH (ref 38–126)
Anion gap: 7 (ref 5–15)
BUN: 6 mg/dL (ref 6–20)
CO2: 21 mmol/L — ABNORMAL LOW (ref 22–32)
Calcium: 9.8 mg/dL (ref 8.9–10.3)
Chloride: 110 mmol/L (ref 101–111)
Creatinine, Ser: 0.45 mg/dL (ref 0.44–1.00)
GFR calc Af Amer: >60 mL/min
GFR calc non Af Amer: >60 mL/min
Glucose, Bld: 76 mg/dL (ref 65–99)
Potassium: 3.9 mmol/L (ref 3.5–5.1)
Sodium: 138 mmol/L (ref 135–145)
Total Bilirubin: 0.4 mg/dL (ref 0.3–1.2)
Total Protein: 6.6 g/dL (ref 6.5–8.1)

## 2015-08-08 LAB — PROTEIN / CREATININE RATIO, URINE
Creatinine, Urine: 61 mg/dL
Protein Creatinine Ratio: 0.13 mg/mg{creat} (ref 0.00–0.15)
Total Protein, Urine: 8 mg/dL

## 2015-08-08 LAB — URIC ACID: Uric Acid, Serum: 5.2 mg/dL (ref 2.3–6.6)

## 2015-08-08 LAB — STREP GP B NAA+RFLX: Strep Gp B NAA+Rflx: NEGATIVE

## 2015-08-08 LAB — POCT FERN TEST: POCT Fern Test: NEGATIVE

## 2015-08-08 LAB — LACTATE DEHYDROGENASE: LDH: 156 U/L (ref 98–192)

## 2015-08-08 MED ORDER — FLEET ENEMA 7-19 GM/118ML RE ENEM: 1.0000 | ENEMA | RECTAL | Status: DC | PRN

## 2015-08-08 MED ORDER — PHENYLEPHRINE 40 MCG/ML (10ML) SYRINGE FOR IV PUSH (FOR BLOOD PRESSURE SUPPORT)
80.0000 ug | PREFILLED_SYRINGE | INTRAVENOUS | Status: DC | PRN
Start: 2015-08-08 — End: 2015-08-09
  Filled 2015-08-08: qty 20
  Filled 2015-08-08: qty 2

## 2015-08-08 MED ORDER — VANCOMYCIN HCL IN DEXTROSE 1-5 GM/200ML-% IV SOLN: 1000.0000 mg | Freq: Two times a day (BID) | INTRAVENOUS | Status: DC

## 2015-08-08 MED ORDER — LIDOCAINE HCL (PF) 1 % IJ SOLN
30.0000 mL | INTRAMUSCULAR | Status: DC | PRN
  Filled 2015-08-08: qty 30

## 2015-08-08 MED ORDER — OXYCODONE-ACETAMINOPHEN 5-325 MG PO TABS: 2.0000 | ORAL_TABLET | ORAL | Status: DC | PRN

## 2015-08-08 MED ORDER — EPHEDRINE 5 MG/ML INJ
10.0000 mg | INTRAVENOUS | Status: DC | PRN
Start: 2015-08-08 — End: 2015-08-09
  Filled 2015-08-08: qty 2

## 2015-08-08 MED ORDER — DIPHENHYDRAMINE HCL 50 MG/ML IJ SOLN: 12.5000 mg | INTRAMUSCULAR | Status: DC | PRN

## 2015-08-08 MED ORDER — FENTANYL 2.5 MCG/ML BUPIVACAINE 1/10 % EPIDURAL INFUSION (WH - ANES)
14.0000 mL/h | INTRAMUSCULAR | Status: DC | PRN
  Administered 2015-08-08 – 2015-08-09 (×2): 14 mL/h via EPIDURAL
  Filled 2015-08-08: qty 125

## 2015-08-08 MED ORDER — OXYTOCIN BOLUS FROM INFUSION: 500.0000 mL | INTRAVENOUS | Status: DC

## 2015-08-08 MED ORDER — FENTANYL CITRATE (PF) 100 MCG/2ML IJ SOLN: 50.0000 ug | INTRAMUSCULAR | Status: DC | PRN

## 2015-08-08 MED ORDER — ACETAMINOPHEN 325 MG PO TABS: 650.0000 mg | ORAL_TABLET | ORAL | Status: DC | PRN

## 2015-08-08 MED ORDER — OXYTOCIN 40 UNITS IN LACTATED RINGERS INFUSION - SIMPLE MED
62.5000 mL/h | INTRAVENOUS | Status: DC
  Administered 2015-08-09: 62.5 mL/h via INTRAVENOUS
  Filled 2015-08-08: qty 1000

## 2015-08-08 MED ORDER — OXYCODONE-ACETAMINOPHEN 5-325 MG PO TABS: 1.0000 | ORAL_TABLET | ORAL | Status: DC | PRN

## 2015-08-08 MED ORDER — VANCOMYCIN HCL IN DEXTROSE 1-5 GM/200ML-% IV SOLN
1000.0000 mg | Freq: Two times a day (BID) | INTRAVENOUS | Status: DC
  Administered 2015-08-08: 1000 mg via INTRAVENOUS
  Filled 2015-08-08 (×2): qty 200

## 2015-08-08 MED ORDER — ONDANSETRON HCL 4 MG/2ML IJ SOLN: 4.0000 mg | Freq: Four times a day (QID) | INTRAMUSCULAR | Status: DC | PRN

## 2015-08-08 MED ORDER — LACTATED RINGERS IV SOLN
500.0000 mL | INTRAVENOUS | Status: DC | PRN
  Administered 2015-08-08 – 2015-08-09 (×2): 500 mL via INTRAVENOUS

## 2015-08-08 MED ORDER — CITRIC ACID-SODIUM CITRATE 334-500 MG/5ML PO SOLN: 30.0000 mL | ORAL | Status: DC | PRN

## 2015-08-08 MED ORDER — LACTATED RINGERS IV SOLN
INTRAVENOUS | Status: DC
  Administered 2015-08-08: 23:00:00 via INTRAVENOUS

## 2015-08-08 NOTE — MAU Provider Note (Signed)
History     CSN: [id number]  Arrival date and time: 08/08/15 1707   First Provider Initiated Contact with Patient 08/08/15 1827      No chief complaint on file.  HPI Chelsea Dorsey 24 y.o. G1P0 @[redacted]w[redacted]d  presents to MAU complaining of contractions and vaginal bleeding.  She noticed bleeding today at noon with wiping.  She saw clear mucus in addition to some blood.  She has nearly soaked a pad with dark red blood in the 4-5 hours since putting it on.  She started with pressure upon awakening this morning.  They've been irregular all day but getting stronger and more frequent throughout the day.   She had her membranes stripped on 10/31 and was dilated to 1cm.  She notes good fetal movement and denies abdominal pain (besides the ctx/pressure), dysuria, leaking of clear fluid.   OB History    Gravida Para Term Preterm AB TAB SAB Ectopic Multiple Living   1               Past Medical History  Diagnosis Date  . UTI (lower urinary tract infection)   . Pregnant 12/10/2014    Past Surgical History  Procedure Laterality Date  . No past surgeries      History reviewed. No pertinent family history.  Social History  Substance Use Topics  . Smoking status: Current Some Day Smoker -- 0.50 packs/day for 5 years    Types: Cigarettes  . Smokeless tobacco: Never Used  . Alcohol Use: No    Allergies:  Allergies  Allergen Reactions  . Penicillins Hives    Has patient had a PCN reaction causing immediate rash, facial/tongue/throat swelling, SOB or lightheadedness with hypotension: No Has patient had a PCN reaction causing severe rash involving mucus membranes or skin necrosis: No Has patient had a PCN reaction that required hospitalization yes Has patient had a PCN reaction occurring within the last 10 years: No If all of the above answers are "NO", then may proceed with Cephalosporin use.    Prescriptions prior to admission  Medication Sig Dispense Refill Last Dose  . acetaminophen  (TYLENOL) 500 MG tablet Take 1,000 mg by mouth every 6 (six) hours as needed for headache.   Past Week at Unknown time  . Prenatal Vit-Fe Fumarate-FA (PRENATAL VITAMIN PO) Take 1 tablet by mouth daily.    Past Week at Unknown time    ROS Pertinent ROS in HPI.  All other systems are negative.   Physical Exam   Blood pressure 146/88, pulse 98, temperature 97.4 F (36.3 C), temperature source Oral, resp. rate 20, last menstrual period 10/25/2014.  Physical Exam  Constitutional: She is oriented to person, place, and time. She appears well-developed and well-nourished. No distress.  HENT:  Head: Normocephalic and atraumatic.  Eyes: Conjunctivae and EOM are normal.  Neck: Normal range of motion. Neck supple.  Cardiovascular: Normal rate and regular rhythm.   Respiratory: Effort normal and breath sounds normal. No respiratory distress.  GI: Soft. She exhibits no distension.  Genitourinary:  Spec inserted.  Pooling of brown liquid noted streaked with blood.  Small amt of blood noted coming from cervical os.   Nurse checked pt's cervix: 1cm dilation  Musculoskeletal: Normal range of motion. She exhibits no edema.  Neurological: She is alert and oriented to person, place, and time.  Skin: Skin is warm and dry.  Psychiatric: She has a normal mood and affect. Her behavior is normal.   Fetal Tracing: Baseline:130s Variability:mod Accelerations: 15x15s  Decelerations:none Toco:q 3 minutes [place]  Initiated Care at  7.2wks  FOB [place], [place], 1st baby, married  Dating By 6wk u/s  Pap 12/25/14: neg  GC/CT Initial:   -/-             36+wks:  -/-  Genetic Screen NT/IT/AFP:declined   CF screen Declined, inadvertently done and was neg  Anatomic [place] Normal female '[name]'  Flu vaccine 07/02/15  Tdap Recommended ~ 28wks  Glucose Screen  2 hr  88/155/94  GBS Pos  Feed Preference breast  Contraception Nexplanon, pamphlet given, ordered 11/1  Circumcision n/a  Childbirth  Classes Not interested  Pediatrician Undecided, info given      MAU Course  Procedures  MDM [place] labs ordered due to elevations in BP Report given and care turned over to Fatima Blank, CNM at 8:00pm.   Ledell Noss, PA-C  Assessment and Plan   [name] 08/08/2015, 6:28 PM   Re-exam:  + Pooling blood tinged fluid, fluid visible coming from os.  Cx now 2-3 cms per RN exam , ctx q 2-3 minutes, much more uncomfortable.  Admit for SROM/early labor.

## 2015-08-08 NOTE — Telephone Encounter (Signed)
Pt states was seen on 08/06/2015 and had her membranes stripped, now having a clear white mucus discharge, irregular contractions, no gush of fluids, +FM, no vaginal bleeding. Pt informed discharge WNL. Pt to go to [facility] for contractions of 5-10 minutes apart and or gush of fluids. Pt verbalized understanding.

## 2015-08-08 NOTE — MAU Note (Signed)
Urine in lab 

## 2015-08-08 NOTE — H&P (Signed)
Arrival date and time: 08/08/15 1707  First Provider Initiated Contact with Patient 08/08/15 1827      HPI Chelsea Dorsey 24 y.o. G1P0 @[redacted]w[redacted]d  presents to [facility]U complaining of contractions and vaginal bleeding. She noticed bleeding today at noon with wiping. She saw clear mucus in addition to some blood. She has nearly soaked a pad with dark red blood in the 4-5 hours since putting it on. She started with pressure upon awakening this morning. They've been irregular all day but getting stronger and more frequent throughout the day. She had her membranes stripped on 10/31 and was dilated to 1cm. She notes good fetal movement and denies abdominal pain (besides the ctx/pressure), dysuria, leaking of clear fluid.  OB History    Gravida Para Term Preterm AB TAB SAB Ectopic Multiple Living   1               Past Medical History  Diagnosis Date  . UTI (lower urinary tract infection)   . Pregnant 12/10/2014    Past Surgical History  Procedure Laterality Date  . No past surgeries      History reviewed. No pertinent family history.  Social History  Substance Use Topics  . Smoking status: Current Some Day Smoker -- 0.50 packs/day for 5 years    Types: Cigarettes  . Smokeless tobacco: Never Used  . Alcohol Use: No    Allergies:  Allergies  Allergen Reactions  . Penicillins Hives    Has patient had a PCN reaction causing immediate rash, facial/tongue/throat swelling, SOB or lightheadedness with hypotension: No Has patient had a PCN reaction causing severe rash involving mucus membranes or skin necrosis: No Has patient had a PCN reaction that required hospitalization yes Has patient had a PCN reaction occurring within the last 10 years: No If all of the above answers are "NO", then may proceed with Cephalosporin use.    Prescriptions prior to admission  Medication Sig Dispense Refill Last Dose   . acetaminophen (TYLENOL) 500 MG tablet Take 1,000 mg by mouth every 6 (six) hours as needed for headache.   Past Week at Unknown time  . Prenatal Vit-Fe Fumarate-FA (PRENATAL VITAMIN PO) Take 1 tablet by mouth daily.    Past Week at Unknown time    ROS Pertinent ROS in HPI. All other systems are negative.   Physical Exam   Blood pressure 146/88, pulse 98, temperature 97.4 F (36.3 C), temperature source Oral, resp. rate 20, last menstrual period 10/25/2014.  Physical Exam  Constitutional: She is oriented to person, place, and time. She appears well-developed and well-nourished. No distress.  HENT:  Head: Normocephalic and atraumatic.  Eyes: Conjunctivae and EOM are normal.  Neck: Normal range of motion. Neck supple.  Cardiovascular: Normal rate and regular rhythm.  Respiratory: Effort normal and breath sounds normal. No respiratory distress.  GI: Soft. She exhibits no distension.  Genitourinary:  Spec inserted. Pooling of brown liquid noted streaked with blood. Small amt of blood noted coming from cervical os.  Nurse checked pt's cervix: 1cm dilation  Musculoskeletal: Normal range of motion. She exhibits no edema.  Neurological: She is alert and oriented to person, place, and time.  Skin: Skin is warm and dry.  Psychiatric: She has a normal mood and affect. Her behavior is normal.   Fetal Tracing: Baseline:130s Variability:mod Accelerations: 15x15s Decelerations:none Toco:q 3 minutes Chelsea Dorsey  Initiated Care at  7.2wks  FOB Chelsea Dorsey, 35yo, 1st baby, married  Dating By 6wk u/s  Pap 12/25/14:  neg  GC/CT Initial: -/- 36+wks: -/-  Genetic Screen NT/IT/AFP:declined   CF screen Declined, inadvertently done and was neg  Anatomic [place] Normal female '[name]' EFW 57% 36 weeks  Flu vaccine 07/02/15  Tdap Recommended ~ 28wks  Glucose Screen  2 hr 88/155/94  GBS Pos  Feed Preference breast   Contraception Nexplanon, pamphlet given, ordered 11/1  Circumcision n/a  Childbirth Classes Not interested  Pediatrician Undecided, info given    MAU Course  Procedures  MDM [place] labs ordered due to elevations in BP Report given and care turned over to Chelsea Dorsey, CNM at 8:00pm.  Chelsea Noss, PA-C  Assessment and Plan   Chelsea Dorsey 08/08/2015, 6:28 PM   Re-exam: + Pooling blood tinged fluid, fluid visible coming from os. Cx now 2-3 cms per RN exam , ctx q 2-3 minutes, much more uncomfortable.  Admit for SROM/early labor.   GBS prophylaxis (cultre + 10/18, but no sensitivities were done. Repeat culture 11/1 was neg.  Will err on side of caution and tx with vancomycin Epidural

## 2015-08-09 ENCOUNTER — Encounter (HOSPITAL_COMMUNITY): Payer: Self-pay | Admitting: *Deleted

## 2015-08-09 DIAGNOSIS — Z3A39 39 weeks gestation of pregnancy: Secondary | ICD-10-CM

## 2015-08-09 DIAGNOSIS — O99334 Smoking (tobacco) complicating childbirth: Secondary | ICD-10-CM

## 2015-08-09 DIAGNOSIS — F17210 Nicotine dependence, cigarettes, uncomplicated: Secondary | ICD-10-CM

## 2015-08-09 DIAGNOSIS — Z6841 Body Mass Index (BMI) 40.0 and over, adult: Secondary | ICD-10-CM

## 2015-08-09 DIAGNOSIS — O99214 Obesity complicating childbirth: Secondary | ICD-10-CM

## 2015-08-09 LAB — TYPE AND SCREEN
ABO/RH(D): O POS
Antibody Screen: NEGATIVE

## 2015-08-09 LAB — ABO/RH: ABO/RH(D): O POS

## 2015-08-09 LAB — RPR: RPR: NONREACTIVE

## 2015-08-09 MED ORDER — SENNOSIDES-DOCUSATE SODIUM 8.6-50 MG PO TABS
2.0000 | ORAL_TABLET | ORAL | Status: DC
  Administered 2015-08-09: 2 via ORAL
  Filled 2015-08-09: qty 2

## 2015-08-09 MED ORDER — ONDANSETRON HCL 4 MG/2ML IJ SOLN: 4.0000 mg | INTRAMUSCULAR | Status: DC | PRN

## 2015-08-09 MED ORDER — OXYTOCIN 40 UNITS IN LACTATED RINGERS INFUSION - SIMPLE MED: 62.5000 mL/h | INTRAVENOUS | Status: DC | PRN

## 2015-08-09 MED ORDER — ZOLPIDEM TARTRATE 5 MG PO TABS: 5.0000 mg | ORAL_TABLET | Freq: Every evening | ORAL | Status: DC | PRN

## 2015-08-09 MED ORDER — LIDOCAINE HCL (PF) 1 % IJ SOLN
INTRAMUSCULAR | Status: DC | PRN
  Administered 2015-08-08: 5 mL via EPIDURAL
  Administered 2015-08-09: 3 mL via EPIDURAL
  Administered 2015-08-09: 2 mL via EPIDURAL

## 2015-08-09 MED ORDER — FERROUS SULFATE 325 (65 FE) MG PO TABS
325.0000 mg | ORAL_TABLET | Freq: Two times a day (BID) | ORAL | Status: DC
  Administered 2015-08-09 – 2015-08-10 (×2): 325 mg via ORAL
  Filled 2015-08-09 (×2): qty 1

## 2015-08-09 MED ORDER — ONDANSETRON HCL 4 MG PO TABS: 4.0000 mg | ORAL_TABLET | ORAL | Status: DC | PRN

## 2015-08-09 MED ORDER — METHYLERGONOVINE MALEATE 0.2 MG PO TABS: 0.2000 mg | ORAL_TABLET | ORAL | Status: DC | PRN

## 2015-08-09 MED ORDER — BISACODYL 10 MG RE SUPP: 10.0000 mg | Freq: Every day | RECTAL | Status: DC | PRN

## 2015-08-09 MED ORDER — PRENATAL MULTIVITAMIN CH
1.0000 | ORAL_TABLET | Freq: Every day | ORAL | Status: DC
  Administered 2015-08-09 – 2015-08-10 (×2): 1 via ORAL
  Filled 2015-08-09 (×2): qty 1

## 2015-08-09 MED ORDER — DIBUCAINE 1 % RE OINT: 1.0000 "application " | TOPICAL_OINTMENT | RECTAL | Status: DC | PRN

## 2015-08-09 MED ORDER — OXYCODONE-ACETAMINOPHEN 5-325 MG PO TABS
1.0000 | ORAL_TABLET | ORAL | Status: DC | PRN
Start: 2015-08-09 — End: 2015-08-10

## 2015-08-09 MED ORDER — IBUPROFEN 600 MG PO TABS
600.0000 mg | ORAL_TABLET | Freq: Four times a day (QID) | ORAL | Status: DC
  Administered 2015-08-09 – 2015-08-10 (×5): 600 mg via ORAL
  Filled 2015-08-09 (×5): qty 1

## 2015-08-09 MED ORDER — LANOLIN HYDROUS EX OINT: TOPICAL_OINTMENT | CUTANEOUS | Status: DC | PRN

## 2015-08-09 MED ORDER — ACETAMINOPHEN 325 MG PO TABS: 650.0000 mg | ORAL_TABLET | ORAL | Status: DC | PRN

## 2015-08-09 MED ORDER — OXYCODONE-ACETAMINOPHEN 5-325 MG PO TABS: 2.0000 | ORAL_TABLET | ORAL | Status: DC | PRN

## 2015-08-09 MED ORDER — MEASLES, MUMPS & RUBELLA VAC ~~LOC~~ INJ
0.5000 mL | INJECTION | Freq: Once | SUBCUTANEOUS | Status: DC
  Filled 2015-08-09: qty 0.5

## 2015-08-09 MED ORDER — TETANUS-DIPHTH-ACELL PERTUSSIS 5-2.5-18.5 LF-MCG/0.5 IM SUSP: 0.5000 mL | Freq: Once | INTRAMUSCULAR | Status: DC

## 2015-08-09 MED ORDER — BENZOCAINE-MENTHOL 20-0.5 % EX AERO: 1.0000 "application " | INHALATION_SPRAY | CUTANEOUS | Status: DC | PRN

## 2015-08-09 MED ORDER — SIMETHICONE 80 MG PO CHEW: 80.0000 mg | CHEWABLE_TABLET | ORAL | Status: DC | PRN

## 2015-08-09 MED ORDER — DIPHENHYDRAMINE HCL 25 MG PO CAPS: 25.0000 mg | ORAL_CAPSULE | Freq: Four times a day (QID) | ORAL | Status: DC | PRN

## 2015-08-09 MED ORDER — WITCH HAZEL-GLYCERIN EX PADS: 1.0000 "application " | MEDICATED_PAD | CUTANEOUS | Status: DC | PRN

## 2015-08-09 MED ORDER — FLEET ENEMA 7-19 GM/118ML RE ENEM: 1.0000 | ENEMA | Freq: Every day | RECTAL | Status: DC | PRN

## 2015-08-09 MED ORDER — METHYLERGONOVINE MALEATE 0.2 MG/ML IJ SOLN: 0.2000 mg | INTRAMUSCULAR | Status: DC | PRN

## 2015-08-09 NOTE — Anesthesia Procedure Notes (Signed)
Epidural Patient location during procedure: OB  Staffing Anesthesiologist: Catalina Gravel Performed by: anesthesiologist   Preanesthetic Checklist Completed: patient identified, pre-op evaluation, timeout performed, IV checked, risks and benefits discussed and monitors and equipment checked  Epidural Patient position: sitting Prep: DuraPrep Patient monitoring: blood pressure and continuous pulse ox Approach: midline Location: L3-L4 Injection technique: LOR air  Needle:  Needle type: Tuohy  Needle gauge: 17 G Needle length: 9 cm Needle insertion depth: 7 cm Catheter size: 19 Gauge Catheter at skin depth: 13 cm Test dose: negative and Other (1% Lidocaine)  Additional Notes Patient identified.  Risk benefits discussed including failed block, incomplete pain control, headache, nerve damage, paralysis, blood pressure changes, nausea, vomiting, reactions to medication both toxic or allergic, and postpartum back pain.  Patient expressed understanding and wished to proceed.  All questions were answered.  Sterile technique used throughout procedure and epidural site dressed with sterile barrier dressing. No paresthesia or other complications noted. The patient did not experience any signs of intravascular injection such as tinnitus or metallic taste in mouth nor signs of intrathecal spread such as rapid motor block. Please see nursing notes for vital signs. Reason for block:procedure for pain

## 2015-08-09 NOTE — Lactation Note (Signed)
This note was copied from the chart of Chelsea Javeria Briski. Lactation Consultation Note  Patient Name: Chelsea Dorsey CNOBS'J Date: 08/09/2015 Reason for consult: Initial assessment Baby is 43 hours old. Mother has concerns that her baby is not latching and hungry. Infant is crying at times and sleepy. Infant is being held skin to skin by mother. Mother has bilateral  flat nipples that dimple with compression. Baby is not latching to the breast. Mother is very concerned that her baby will not ever be able to latch due to her nipples. She is asking for a breast pump. Assisted mother with hand expression and nipple roll to erect nipple. Unable to express colostrum at this time. Nipple remained flat with manual stimulation.  Instructed mother on the use of an inverted breast shells to wear between feedings or prior to feeding to assist with nipple protrusion. After several attempts to latch baby unsuccessfully to the breast, a #20 nipple shield was applied. Parents were educated on application and cleaning. Baby latched well using the shield and mother felt tugs on the breast. Baby fed for 5 minutes before falling asleep and was not interested in re-latching. Lots of education related to breastfeeding with parents. Mother requested DEBP for additional stimulation and pump was set up with instructions. Mom made aware of O/P services, breastfeeding support groups, community resources, and our phone # for post-discharge questions. Mother encouraged to ask RN for assistance and LC will follow due to early signs of feeding difficulties and need for reassurance,  Maternal Data Has patient been taught Hand Expression?: Yes Does the patient have breastfeeding experience prior to this delivery?: No  Feeding Feeding Type: Breast Fed  LATCH Score/Interventions Latch: Grasps breast easily, tongue down, lips flanged, rhythmical sucking. (grasp breast with nipple shield well)  Audible Swallowing: A few with  stimulation Intervention(s): Hand expression;Skin to skin;Alternate breast massage  Type of Nipple: Flat  Comfort (Breast/Nipple): Soft / non-tender     Hold (Positioning): Assistance needed to correctly position infant at breast and maintain latch.  LATCH Score: 7  Lactation Tools Discussed/Used Tools: Pump;Shells;Nipple [place] [employer] (Mother has flat nipples, dimple with compression) Nipple shield size: 20 Shell Type: Inverted Breast pump type: Manual WIC Program: Yes Pump Review: Setup, frequency, and cleaning Initiated by:: BDaly Date initiated:: 08/09/15   Consult Status Consult Status: Complete    Gwenevere Abbot 08/09/2015, 10:45 AM

## 2015-08-09 NOTE — Progress Notes (Signed)
Assumed care of patient.

## 2015-08-09 NOTE — Progress Notes (Signed)
   Chelsea Dorsey is a 24 y.o. G1P0 at [redacted]w[redacted]d  admitted for active labor  Subjective: Comfortable with epidural Objective: Filed Vitals:   08/09/15 0401 08/09/15 0431 08/09/15 0435 08/09/15 0505  BP: 142/99 164/114 115/68 120/72  Pulse: 108 112 106 123  Temp:  99 F (37.2 C)    TempSrc:  Oral    Resp:  18    Height:      Weight:      SpO2:          FHT:  FHR: 140 bpm, variability: moderate,  accelerations:  Present,  decelerations:  Absent UC:   regular, every 2 minutes SVE:   Lip/-2  AROM forebag, clear  Labs: Lab Results  Component Value Date   WBC 18.5* 08/08/2015   HGB 15.6* 08/08/2015   HCT 43.9 08/08/2015   MCV 93.4 08/08/2015   PLT 162 08/08/2015    Assessment / Plan: Spontaneous labor, progressing normally  Labor: Progressing normally Fetal Wellbeing:  Category I Pain Control:  Epidural Anticipated MOD:  NSVD  CRESENZO-DISHMAN,Fara Worthy 08/09/2015, 5:52 AM

## 2015-08-09 NOTE — Anesthesia Postprocedure Evaluation (Signed)
  Anesthesia Post-op Note  Patient: Chelsea Dorsey  Procedure(s) Performed: * No procedures listed *  Patient Location: Mother/Baby  Anesthesia Type:Epidural  Level of Consciousness: awake, alert , oriented and patient cooperative  Airway and Oxygen Therapy: Patient Spontanous Breathing  Post-op Pain: none  Post-op Assessment: Post-op Vital signs reviewed, Patient's Cardiovascular Status Stable, Respiratory Function Stable, Patent Airway, No headache, No backache and Patient able to bend at knees              Post-op Vital Signs: Reviewed and stable  Last Vitals:  Filed Vitals:   08/09/15 1430  BP: 114/73  Pulse: 86  Temp: 36.6 C  Resp: 18    Complications: No apparent anesthesia complications

## 2015-08-09 NOTE — Progress Notes (Signed)
2 additional RNs at bedside for delivery

## 2015-08-09 NOTE — Progress Notes (Signed)
Patient feels incredible urge to defecate. States she has not done so in 2 days. Patient sitting on bedpan, but instructed to not bear down while trying to go.

## 2015-08-09 NOTE — Anesthesia Preprocedure Evaluation (Addendum)
Anesthesia Evaluation  Patient identified by MRN, date of birth, ID band Patient awake    Reviewed: Allergy & Precautions, NPO status , Patient's Chart, lab work & pertinent test results  History of Anesthesia Complications Negative for: history of anesthetic complications  Airway Mallampati: III  TM Distance: >3 FB Neck ROM: Full    Dental  (+) Teeth Intact, Dental Advisory Given   Pulmonary Current Smoker,    Pulmonary exam normal breath sounds clear to auscultation       Cardiovascular Exercise Tolerance: Good negative cardio ROS Normal cardiovascular exam Rhythm:Regular Rate:Normal     Neuro/Psych negative neurological ROS  negative psych ROS   GI/Hepatic negative GI ROS, Neg liver ROS,   Endo/Other  Morbid obesity  Renal/GU negative Renal ROS     Musculoskeletal negative musculoskeletal ROS (+)   Abdominal   Peds  Hematology negative hematology ROS (+)   Anesthesia Other Findings Day of surgery medications reviewed with the patient.  Reproductive/Obstetrics (+) Pregnancy                            Anesthesia Physical Anesthesia Plan  ASA: III  Anesthesia Plan: Epidural   Post-op Pain Management:    Induction:   Airway Management Planned:   Additional Equipment:   Intra-op Plan:   Post-operative Plan:   Informed Consent: I have reviewed the patients History and Physical, chart, labs and discussed the procedure including the risks, benefits and alternatives for the proposed anesthesia with the patient or authorized representative who has indicated his/her understanding and acceptance.   Dental advisory given  Plan Discussed with:   Anesthesia Plan Comments: (Patient identified. Risks/Benefits/Options discussed with patient including but not limited to bleeding, infection, nerve damage, paralysis, failed block, incomplete pain control, headache, blood pressure changes,  nausea, vomiting, reactions to medication both or allergic, itching and postpartum back pain. Confirmed with bedside nurse the patient's most recent platelet count. Confirmed with patient that they are not currently taking any anticoagulation, have any bleeding history or any family history of bleeding disorders. Patient expressed understanding and wished to proceed. All questions were answered. )        Anesthesia Quick Evaluation

## 2015-08-10 MED ORDER — IBUPROFEN 600 MG PO TABS: 600.0000 mg | ORAL_TABLET | Freq: Four times a day (QID) | ORAL | Status: DC

## 2015-08-10 NOTE — Discharge Summary (Signed)
OB Discharge Summary    Patient Name: Chelsea Dorsey DOB: 1990/11/09 MRN: [id number]  Date of admission: 08/08/2015 Delivering MD: Christin Fudge   Date of discharge: 08/10/2015  Admitting diagnosis: Labor Intrauterine pregnancy: [redacted]w[redacted]d     Secondary diagnosis: Active Problems:   Supervision of normal first pregnancy      Discharge diagnosis: Term Pregnancy Delivered                                                                                                Post partum procedures: none  Augmentation: None  Complications: None  Hospital course:   Chelsea Dorsey is a 24 y.o. G1P1001 with no significant prenatal history who presented on 08/08/2015 at [redacted]w[redacted]d for spontaneous onset of labor. Patient had an uncomplicated labor course. She progressed to active labor without issue following or augmentation. At  6:07 AM on 08/09/15 she delivered a healthy baby girl via normal spontaneous vaginal delivery. Pateint had an uncomplicated postpartum course and was discharged home in stable condition on 08/10/2015.  Exam at time of discharge:  Subjective: Patient reports doing well this morning. Pain is controlled. Ambulating to bathroom without issues, reports normal voiding. Tolerating regular diet. Reports mild lochia with minimal bleeding, which is decreasing. Pertinent negatives on ROS include no blurry vision, no headache, no dyspnea, no chest pain, no epigastric or RUQ pain, no nausea or vomiting, no fevers, no hematuria, no dysuria.  Objective: Filed Vitals:   08/09/15 0900 08/09/15 1030 08/09/15 1430 08/10/15 0554  BP: 121/70 130/80 114/73 108/63  Pulse: 87 77 86 65  Temp: 98.5 F (36.9 C) 97.8 F (36.6 C) 97.8 F (36.6 C) 97.5 F (36.4 C)  TempSrc: Oral Oral Oral Oral  Resp: 18 18 18 16     GEN: Alert, comfortable-appearing woman resting in hospital bed, holding baby. Husband at the bedside. PULM: CTAB CV: RRR, S1 and S2 heard, no M/R/G appreciated ABD:  Fundus firm below umbilicus. Abdomen appropriately TTP. No epigastric or RUQ pain. No guarding. GU: Lochia appropriate. EXTR: No LE edema or calf tenderness.   Discharge instruction: per After Visit Summary and "Baby and Me Booklet". -- Ibuprofen for pain management -- Continue prenatal vitamin -- Pelvic rest for 6 weeks -- f/u visits in 2 and 6 weeks with prenatal care provider  Medications:    Medication List    TAKE these medications        acetaminophen 500 MG tablet  Commonly known as:  TYLENOL  Take 1,000 mg by mouth every 6 (six) hours as needed for headache.     ibuprofen 600 MG tablet  Commonly known as:  ADVIL,MOTRIN  Take 1 tablet (600 mg total) by mouth every 6 (six) hours.     PRENATAL VITAMIN PO  Take 1 tablet by mouth daily.       Diet: routine diet  Activity: Advance as tolerated. Pelvic rest for 6 weeks.   Outpatient follow up: 6 weeks Future Appointments Date Time Provider [place]  08/13/2015 12:00 PM Christin Fudge, CNM [facility]    Postpartum contraception: Nexplanon, patient already has appt scheduled for  08/13/15  Newborn Data: Live born female  Birth Weight: 7 lb 15.9 oz (3625 g) APGAR: 8, 9  Baby Feeding: Breast Disposition:rooming in   Keane Scrape, MD 08/10/2015 11:15 AM   OB fellow attestation I have seen and examined this patient and agree with above documentation in the resident's note.   Chelsea Dorsey is a 24 y.o. G1P1001 s/p NSVD.   Pain is well controlled.  Plan for birth control is Nexplanon.  Method of Feeding: Breast  PE:  BP 108/63 mmHg  Pulse 65  Temp(Src) 97.5 F (36.4 C) (Oral)  Resp 16  Ht 5\' 3"  (1.6 m)  Wt 244 lb (110.678 kg)  BMI 43.23 kg/m2  SpO2 98%  LMP 10/25/2014 (Approximate)  Breastfeeding? Unknown Gen: well appearing Heart: reg rate Lungs: normal WOB Fundus firm Ext: soft, no pain, no edema   Recent Labs  08/08/15 1845  HGB 15.6*  HCT 43.9   Plan:  discharge today - postpartum care discussed - f/u clinic in 6 weeks for postpartum visit  Caren Macadam, MD 7:05 PM

## 2015-08-10 NOTE — Discharge Instructions (Signed)
Postpartum Care After Vaginal Delivery °After you deliver your newborn (postpartum period), the usual stay in the hospital is 24-72 hours. If there were problems with your labor or delivery, or if you have other medical problems, you might be in the hospital longer.  °While you are in the hospital, you will receive help and instructions on how to care for yourself and your newborn during the postpartum period.  °While you are in the hospital: °· Be sure to tell your nurses if you have pain or discomfort, as well as where you feel the pain and what makes the pain worse. °· If you had an incision made near your vagina (episiotomy) or if you had some tearing during delivery, the nurses may put ice packs on your episiotomy or tear. The ice packs may help to reduce the pain and swelling. °· If you are breastfeeding, you may feel uncomfortable contractions of your uterus for a couple of weeks. This is normal. The contractions help your uterus get back to normal size. °· It is normal to have some bleeding after delivery. °· For the first 1-3 days after delivery, the flow is red and the amount may be similar to a period. °· It is common for the flow to start and stop. °· In the first few days, you may pass some small clots. Let your nurses know if you begin to pass large clots or your flow increases. °· Do not  flush blood clots down the toilet before having the nurse look at them. °· During the next 3-10 days after delivery, your flow should become more watery and pink or brown-tinged in color. °· Ten to fourteen days after delivery, your flow should be a small amount of yellowish-white discharge. °· The amount of your flow will decrease over the first few weeks after delivery. Your flow may stop in 6-8 weeks. Most women have had their flow stop by 12 weeks after delivery. °· You should change your sanitary pads frequently. °· Wash your hands thoroughly with soap and water for at least 20 seconds after changing pads, using  the toilet, or before holding or feeding your newborn. °· You should feel like you need to empty your bladder within the first 6-8 hours after delivery. °· In case you become weak, lightheaded, or faint, call your nurse before you get out of bed for the first time and before you take a shower for the first time. °· Within the first few days after delivery, your breasts may begin to feel tender and full. This is called engorgement. Breast tenderness usually goes away within 48-72 hours after engorgement occurs. You may also notice milk leaking from your breasts. If you are not breastfeeding, do not stimulate your breasts. Breast stimulation can make your breasts produce more milk. °· Spending as much time as possible with your newborn is very important. During this time, you and your newborn can feel close and get to know each other. Having your newborn stay in your room (rooming in) will help to strengthen the bond with your newborn.  It will give you time to get to know your newborn and become comfortable caring for your newborn. °· Your hormones change after delivery. Sometimes the hormone changes can temporarily cause you to feel sad or tearful. These feelings should not last more than a few days. If these feelings last longer than that, you should talk to your caregiver. °· If desired, talk to your caregiver about methods of family planning or contraception. °·   Talk to your caregiver about immunizations. Your caregiver may want you to have the following immunizations before leaving the hospital:  Tetanus, diphtheria, and pertussis (Tdap) or tetanus and diphtheria (Td) immunization. It is very important that you and your family (including grandparents) or others caring for your newborn are up-to-date with the Tdap or Td immunizations. The Tdap or Td immunization can help protect your newborn from getting ill.  Rubella immunization.  Varicella (chickenpox) immunization.  Influenza immunization. You should  receive this annual immunization if you did not receive the immunization during your pregnancy.   This information is not intended to replace advice given to you by your health care provider. Make sure you discuss any questions you have with your health care provider.   Document Released: 07/19/2007 Document Revised: 06/15/2012 Document Reviewed: 05/18/2012 Elsevier Interactive Patient Education [employer].   Breastfeeding Deciding to breastfeed is one of the best choices you can make for you and your baby. A change in hormones during pregnancy causes your breast tissue to grow and increases the number and size of your milk ducts. These hormones also allow proteins, sugars, and fats from your blood supply to make breast milk in your milk-producing glands. Hormones prevent breast milk from being released before your baby is born as well as prompt milk flow after birth. Once breastfeeding has begun, thoughts of your baby, as well as his or her sucking or crying, can stimulate the release of milk from your milk-producing glands.  BENEFITS OF BREASTFEEDING For Your Baby  Your first milk (colostrum) helps your baby's digestive system function better.  There are antibodies in your milk that help your baby fight off infections.  Your baby has a lower incidence of asthma, allergies, and sudden infant death syndrome.  The nutrients in breast milk are better for your baby than infant formulas and are designed uniquely for your baby's needs.  Breast milk improves your baby's brain development.  Your baby is less likely to develop other conditions, such as childhood obesity, asthma, or type 2 diabetes mellitus. For You  Breastfeeding helps to create a very special bond between you and your baby.  Breastfeeding is convenient. Breast milk is always available at the correct temperature and costs nothing.  Breastfeeding helps to burn calories and helps you lose the weight gained during  pregnancy.  Breastfeeding makes your uterus contract to its prepregnancy size faster and slows bleeding (lochia) after you give birth.   Breastfeeding helps to lower your risk of developing type 2 diabetes mellitus, osteoporosis, and breast or ovarian cancer later in life. SIGNS THAT YOUR BABY IS HUNGRY Early Signs of Hunger  Increased alertness or activity.  Stretching.  Movement of the head from side to side.  Movement of the head and opening of the mouth when the corner of the mouth or cheek is stroked (rooting).  Increased sucking sounds, smacking lips, cooing, sighing, or squeaking.  Hand-to-mouth movements.  Increased sucking of fingers or hands. Late Signs of Hunger  Fussing.  Intermittent crying. Extreme Signs of Hunger Signs of extreme hunger will require calming and consoling before your baby will be able to breastfeed successfully. Do not wait for the following signs of extreme hunger to occur before you initiate breastfeeding:  Restlessness.  A loud, strong cry.  Screaming. BREASTFEEDING BASICS Breastfeeding Initiation  Find a comfortable place to sit or lie down, with your neck and back well supported.  Place a pillow or rolled up blanket under your baby to bring  him or her to the level of your breast (if you are seated). Nursing pillows are specially designed to help support your arms and your baby while you breastfeed.  Make sure that your baby's abdomen is facing your abdomen.  Gently massage your breast. With your fingertips, massage from your chest wall toward your nipple in a circular motion. This encourages milk flow. You may need to continue this action during the feeding if your milk flows slowly.  Support your breast with 4 fingers underneath and your thumb above your nipple. Make sure your fingers are well away from your nipple and your baby's mouth.  Stroke your baby's lips gently with your finger or nipple.  When your baby's mouth is open  wide enough, quickly bring your baby to your breast, placing your entire nipple and as much of the colored area around your nipple (areola) as possible into your baby's mouth.  More areola should be visible above your baby's upper lip than below the lower lip.  Your baby's tongue should be between his or her lower gum and your breast.  Ensure that your baby's mouth is correctly positioned around your nipple (latched). Your baby's lips should create a seal on your breast and be turned out (everted).  It is common for your baby to suck about 2-3 minutes in order to start the flow of breast milk. Latching Teaching your baby how to latch on to your breast properly is very important. An improper latch can cause nipple pain and decreased milk supply for you and poor weight gain in your baby. Also, if your baby is not latched onto your nipple properly, he or she may swallow some air during feeding. This can make your baby fussy. Burping your baby when you switch breasts during the feeding can help to get rid of the air. However, teaching your baby to latch on properly is still the best way to prevent fussiness from swallowing air while breastfeeding. Signs that your baby has successfully latched on to your nipple:  Silent tugging or silent sucking, without causing you pain.  Swallowing heard between every 3-4 sucks.  Muscle movement above and in front of his or her ears while sucking. Signs that your baby has not successfully latched on to nipple:  Sucking sounds or smacking sounds from your baby while breastfeeding.  Nipple pain. If you think your baby has not latched on correctly, slip your finger into the corner of your baby's mouth to break the suction and place it between your baby's gums. Attempt breastfeeding initiation again. Signs of Successful Breastfeeding Signs from your baby:  A gradual decrease in the number of sucks or complete cessation of sucking.  Falling asleep.  Relaxation  of his or her body.  Retention of a small amount of milk in his or her mouth.  Letting go of your breast by himself or herself. Signs from you:  Breasts that have increased in firmness, weight, and size 1-3 hours after feeding.  Breasts that are softer immediately after breastfeeding.  Increased milk volume, as well as a change in milk consistency and color by the fifth day of breastfeeding.  Nipples that are not sore, cracked, or bleeding. Signs That Your [place] is Getting Enough Milk  Wetting at least 3 diapers in a 24-hour period. The urine should be clear and pale yellow by age 71 days.  At least 3 stools in a 24-hour period by age 71 days. The stool should be soft and yellow.  At  least 3 stools in a 24-hour period by age 22 days. The stool should be seedy and yellow.  No loss of weight greater than 10% of birth weight during the first 9 days of age.  Average weight gain of 4-7 ounces (113-198 g) per week after age 86 days.  Consistent daily weight gain by age 229 days, without weight loss after the age of 2 weeks. After a feeding, your baby may spit up a small amount. This is common. BREASTFEEDING FREQUENCY AND DURATION Frequent feeding will help you make more milk and can prevent sore nipples and breast engorgement. Breastfeed when you feel the need to reduce the fullness of your breasts or when your baby shows signs of hunger. This is called "breastfeeding on demand." Avoid introducing a pacifier to your baby while you are working to establish breastfeeding (the first 4-6 weeks after your baby is born). After this time you may choose to use a pacifier. Research has shown that pacifier use during the first year of a baby's life decreases the risk of sudden infant death syndrome (SIDS). Allow your baby to feed on each breast as long as he or she wants. Breastfeed until your baby is finished feeding. When your baby unlatches or falls asleep while feeding from the first breast, offer the  second breast. Because newborns are often sleepy in the first few weeks of life, you may need to awaken your baby to get him or her to feed. Breastfeeding times will vary from baby to baby. However, the following rules can serve as a guide to help you ensure that your baby is properly fed:  Newborns (babies 57 weeks of age or younger) may breastfeed every 1-3 hours.  Newborns should not go longer than 3 hours during the day or 5 hours during the night without breastfeeding.  You should breastfeed your baby a minimum of 8 times in a 24-hour period until you begin to introduce solid foods to your baby at around 61 months of age. BREAST MILK PUMPING Pumping and storing breast milk allows you to ensure that your baby is exclusively fed your breast milk, even at times when you are unable to breastfeed. This is especially important if you are going back to work while you are still breastfeeding or when you are not able to be present during feedings. Your lactation consultant can give you guidelines on how long it is safe to store breast milk. A breast pump is a machine that allows you to pump milk from your breast into a sterile bottle. The pumped breast milk can then be stored in a refrigerator or freezer. Some breast pumps are operated by hand, while others use electricity. Ask your lactation consultant which type will work best for you. Breast pumps can be purchased, but some hospitals and breastfeeding support groups lease breast pumps on a monthly basis. A lactation consultant can teach you how to hand express breast milk, if you prefer not to use a pump. CARING FOR YOUR BREASTS WHILE YOU BREASTFEED Nipples can become dry, cracked, and sore while breastfeeding. The following recommendations can help keep your breasts moisturized and healthy:  Avoid using soap on your nipples.  Wear a supportive bra. Although not required, special nursing bras and tank tops are designed to allow access to your breasts  for breastfeeding without taking off your entire bra or top. Avoid wearing underwire-style bras or extremely tight bras.  Air dry your nipples for 3-79minutes after each feeding.  Use only cotton  bra pads to absorb leaked breast milk. Leaking of breast milk between feedings is normal.  Use lanolin on your nipples after breastfeeding. Lanolin helps to maintain your skin's normal moisture barrier. If you use pure lanolin, you do not need to wash it off before feeding your baby again. Pure lanolin is not toxic to your baby. You may also hand express a few drops of breast milk and gently massage that milk into your nipples and allow the milk to air dry. In the first few weeks after giving birth, some women experience extremely full breasts (engorgement). Engorgement can make your breasts feel heavy, warm, and tender to the touch. Engorgement peaks within 3-5 days after you give birth. The following recommendations can help ease engorgement:  Completely empty your breasts while breastfeeding or pumping. You may want to start by applying warm, moist heat (in the shower or with warm water-soaked hand towels) just before feeding or pumping. This increases circulation and helps the milk flow. If your baby does not completely empty your breasts while breastfeeding, pump any extra milk after he or she is finished.  Wear a snug bra (nursing or regular) or tank top for 1-2 days to signal your body to slightly decrease milk production.  Apply ice packs to your breasts, unless this is too uncomfortable for you.  Make sure that your baby is latched on and positioned properly while breastfeeding. If engorgement persists after 48 hours of following these recommendations, contact your health care provider or a Science writer. OVERALL HEALTH CARE RECOMMENDATIONS WHILE BREASTFEEDING  Eat healthy foods. Alternate between meals and snacks, eating 3 of each per day. Because what you eat affects your breast milk,  some of the foods may make your baby more irritable than usual. Avoid eating these foods if you are sure that they are negatively affecting your baby.  Drink milk, fruit juice, and water to satisfy your thirst (about 10 glasses a day).  Rest often, relax, and continue to take your prenatal vitamins to prevent fatigue, stress, and anemia.  Continue breast self-awareness checks.  Avoid chewing and smoking tobacco. Chemicals from cigarettes that pass into breast milk and exposure to secondhand smoke may harm your baby.  Avoid alcohol and drug use, including marijuana. Some medicines that may be harmful to your baby can pass through breast milk. It is important to ask your health care provider before taking any medicine, including all over-the-counter and prescription medicine as well as vitamin and herbal supplements. It is possible to become pregnant while breastfeeding. If birth control is desired, ask your health care provider about options that will be safe for your baby. SEEK MEDICAL CARE IF:  You feel like you want to stop breastfeeding or have become frustrated with breastfeeding.  You have painful breasts or nipples.  Your nipples are cracked or bleeding.  Your breasts are red, tender, or warm.  You have a swollen area on either breast.  You have a fever or chills.  You have nausea or vomiting.  You have drainage other than breast milk from your nipples.  Your breasts do not become full before feedings by the fifth day after you give birth.  You feel sad and depressed.  Your baby is too sleepy to eat well.  Your baby is having trouble sleeping.   Your baby is wetting less than 3 diapers in a 24-hour period.  Your baby has less than 3 stools in a 24-hour period.  Your baby's skin or the white part  of his or her eyes becomes yellow.   Your baby is not gaining weight by 31 days of age. SEEK IMMEDIATE MEDICAL CARE IF:  Your baby is overly tired (lethargic) and does  not want to wake up and feed.  Your baby develops an unexplained fever.   This information is not intended to replace advice given to you by your health care provider. Make sure you discuss any questions you have with your health care provider.   Document Released: 09/21/2005 Document Revised: 06/12/2015 Document Reviewed: 03/15/2013 Elsevier Interactive Patient Education 2016 [employer] Tips If you are breastfeeding, there may be times when you cannot feed your baby directly. Returning to work or going on a trip are common examples. Pumping allows you to store breast milk and feed it to your baby later.  You may not get much milk when you first start to pump. Your breasts should start to make more after a few days. If you pump at the times you usually feed your baby, you may be able to keep making enough milk to feed your baby without also using formula. The more often you pump, the more milk you will produce. WHEN SHOULD I PUMP?   You can begin to pump soon after delivery. However, some experts recommend waiting about 4 weeks before giving your infant a bottle to make sure breastfeeding is going well.  If you plan to return to work, begin pumping a few weeks before. This will help you develop techniques that work best for you. It also lets you build up a supply of breast milk.   When you are with your infant, feed on demand and pump after each feeding.   When you are away from your infant for several hours, pump for about 15 minutes every 2-3 hours. Pump both breasts at the same time if you can.   If your infant has a formula feeding, make sure to pump around the same time.   If you drink any alcohol, wait 2 hours before pumping.  HOW DO I PREPARE TO PUMP? Your let-down reflexis the natural reaction to stimulation that makes your breast milk flow. It is easier to stimulate this reflex when you are relaxed. Find relaxation techniques that work for you. If you  have difficulty with your let-down reflex, try these methods:   Smell one of your infant's blankets or an item of clothing.   Look at a picture or video of your infant.   Sit in a quiet, private space.   Massage the breast you plan to pump.   Place soothing warmth on the breast.   Play relaxing music.  WHAT ARE SOME GENERAL BREAST PUMPING TIPS?  Wash your hands before you pump. You do not need to wash your nipples or breasts.  There are three ways to pump.  You can use your hand to massage and compress your breast.  You can use a handheld manual pump.  You can use an electric pump.   Make sure the suction cup (flange) on the breast pump is the right size. Place the flange directly over the nipple. If it is the wrong size or placed the wrong way, it may be painful and cause nipple damage.   If pumping is uncomfortable, apply a small amount of purified or modified lanolin to your nipple and areola.  If you are using an electric pump, adjust the speed and suction power to be more comfortable.  If pumping is painful  or if you are not getting very much milk, you may need a different type of pump. A lactation consultant can help you determine what type of pump to use.   Keep a full water bottle near you at all times. Drinking lots of fluid helps you make more milk.  You can store your milk to use later. Pumped breast milk can be stored in a sealable, sterile container or plastic bag. Label all stored breast milk with the date you pumped it.  Milk can stay out at room temperature for up to 8 hours.  You can store your milk in the refrigerator for up to 8 days.  You can store your milk in the freezer for 3 months. Thaw frozen milk using warm water. Do not put it in the microwave.  Do not smoke. Smoking can lower your milk supply and harm your infant. If you need help quitting, ask your health care provider to recommend a program.  [place] A LACTATION CONSULTANT?  You are having trouble pumping.  You are concerned that you are not making enough milk.  You have nipple pain, soreness, or redness.  You want to use birth control. Birth control pills may lower your milk supply. Talk to your health care provider about your options.   This information is not intended to replace advice given to you by your health care provider. Make sure you discuss any questions you have with your health care provider.   Document Released: 03/11/2010 Document Revised: 09/26/2013 Document Reviewed: 07/14/2013 Elsevier Interactive Patient Education 2016 [employer].   Breastfeeding Challenges and Solutions Even though breastfeeding is natural, it can be challenging, especially in the first few weeks after childbirth. It is normal for problems to arise when starting to breastfeed your new baby, even if you have breastfed before. This document provides some solutions to the most common breastfeeding challenges.  CHALLENGES AND SOLUTIONS Challenge--Cracked or Sore Nipples Cracked or sore nipples are commonly experienced by breastfeeding mothers. Cracked or sore nipples often are caused by inadequate latching (when your baby's mouth attaches to your breast to breastfeed). Soreness can also happen if your baby is not positioned properly at your breast. Although nipple cracking and soreness are common during the first week after birth, nipple pain is never normal. If you experience nipple cracking or soreness that lasts longer than 1 week or nipple pain, call your health care provider or lactation consultant.  Solution Ensure proper latching and positioning of your baby by following the steps below:  Find a comfortable place to sit or lie down, with your neck and back well supported.  Place a pillow or rolled up blanket under your baby to bring him or her to the level of your breast (if you are seated).  Make sure that your baby's abdomen is  facing your abdomen.  Gently massage your breast. With your fingertips, massage from your chest wall toward your nipple in a circular motion. This encourages milk flow. You may need to continue this action during the feeding if your milk flows slowly.  Support your breast with 4 fingers underneath and your thumb above your nipple. Make sure your fingers are well away from your nipple and your baby's mouth.  Stroke your baby's lips gently with your finger or nipple.  When your baby's mouth is open wide enough, quickly bring your baby to your breast, placing your entire nipple and as much of the colored area  around your nipple (areola) as possible into your baby's mouth.  More areola should be visible above your baby's upper lip than below the lower lip.  Your baby's tongue should be between his or her lower gum and your breast.  Ensure that your baby's mouth is correctly positioned around your nipple (latched). Your baby's lips should create a seal on your breast and be turned out (everted).  It is common for your baby to suck for about 2-3 minutes in order to start the flow of breast milk. Signs that your baby has successfully latched on to your nipple include:   Quietly tugging or quietly sucking without causing you pain.   Swallowing heard between every 3-4 sucks.   Muscle movement above and in front of his or her ears with sucking.  Signs that your baby has not successfully latched on to nipple include:   Sucking sounds or smacking sounds from your baby while nursing.   Nipple pain.  Ensure that your breasts stay moisturized and healthy by:  Avoiding the use of soap on your nipples.   Wearing a supportive bra. Avoid wearing underwire-style bras or tight bras.   Air drying your nipples for 3-4 minutes after each feeding.   Using only cotton bra pads to absorb breast milk leakage. Leaking of breast milk between feedings is normal. Be sure to change the pads if they  become soaked with milk.  Using lanolin on your nipples after nursing. Lanolin helps to maintain your skin's normal moisture barrier. If you use pure lanolin you do not need to wash it off before feeding your baby again. Pure lanolin is not toxic to your baby. You may also hand express a few drops of breast milk and gently massage that milk into your nipples, allowing it to air dry. Challenge--Breast Engorgement Breast engorgement is the overfilling of your breasts with breast milk. In the first few weeks after giving birth, you may experience breast engorgement. Breast engorgement can make your breasts throb and feel hard, tightly stretched, warm, and tender. Engorgement peaks about the fifth day after you give birth. Having breast engorgement does not mean you have to stop breastfeeding your baby. Solution  Breastfeed when you feel the need to reduce the fullness of your breasts or when your baby shows signs of hunger. This is called "breastfeeding on demand."  Newborns (babies younger than 4 weeks) often breastfeed every 1-3 hours during the day. You may need to awaken your baby to feed if he or she is asleep at a feeding time.  Do not allow your baby to sleep longer than 5 hours during the night without a feeding.  Pump or hand express breast milk before breastfeeding to soften your breast, areola, and nipple.  Apply warm, moist heat (in the shower or with warm water-soaked hand towels) just before feeding or pumping, or massage your breast before or during breastfeeding. This increases circulation and helps your milk to flow.  Completely empty your breasts when breastfeeding or pumping. Afterward, wear a snug bra (nursing or regular) or tank top for 1-2 days to signal your body to slightly decrease milk production. Only wear snug bras or tank tops to treat engorgement. Tight bras typically should be avoided by breastfeeding mothers. Once engorgement is relieved, return to wearing regular,  loose-fitting clothes.  Apply ice packs to your breasts to lessen the pain from engorgement and relieve swelling, unless the ice is uncomfortable for you.  Do not delay feedings. Try to relax  when it is time to feed your baby. This helps to trigger your "let-down reflex," which releases milk from your breast.  Ensure your baby is latched on to your breast and positioned properly while breastfeeding.  Allow your baby to remain at your breast as long as he or she is latched on well and actively sucking. Your baby will let you know when he or she is done breastfeeding by pulling away from your breast or falling asleep.  Avoid introducing bottles or pacifiers to your baby in the early weeks of breastfeeding. Wait to introduce these things until after resolving any breastfeeding challenges.  Try to pump your milk on the same schedule as when your baby would breastfeed if you are returning to work or away from home for an extended period.  Drink plenty of fluids to avoid dehydration, which can eventually put you at greater risk of breast engorgement. If you follow these suggestions, your engorgement should improve in 24-48 hours. If you are still experiencing difficulty, call your lactation consultant or health care provider.  Challenge--Plugged Milk Ducts Plugged milk ducts occur when the duct does not drain milk effectively and becomes swollen. Wearing a tight-fitting nursing bra or having difficulty with latching may cause plugged milk ducts. Not drinking enough water (8-10 c [1.9-2.4 L] per day) can contribute to plugged milk ducts. Once a duct has become plugged, hard lumps, soreness, and redness may develop in your breast.  Solution Do not delay feedings. Feed your baby frequently and try to empty your breasts of milk at each feeding. Try breastfeeding from the affected side first so there is a better chance that the milk will drain completely from that breast. Apply warm, moist towels to your  breasts for 5-10 minutes before feeding. Alternatively, a hot shower right before breastfeeding can provide the moist heat that can encourage milk flow. Gentle massage of the sore area before and during a feeding may also help. Avoid wearing tight clothing or bras that put pressure on your breasts. Wear bras that offer good support to your breasts, but avoid underwire bras. If you have a plugged milk duct and develop a fever, you need to see your health care provider.  Challenge--Mastitis Mastitis is inflammation of your breast. It usually is caused by a bacterial infection and can cause flu-like symptoms. You may develop redness in your breast and a fever. Often when mastitis occurs, your breast becomes firm, warm, and very painful. The most common causes of mastitis are poor latching, ineffective sucking from your baby, consistent pressure on your breast (possibly from wearing a tight-fitting bra or shirt that restricts the milk flow), unusual stress or fatigue, or missed feedings.  Solution You will be given antibiotic medicine to treat the infection. It is still important to breastfeed frequently to empty your breasts. Continuing to breastfeed while you recover from mastitis will not harm your baby. Make sure your baby is positioned properly during every feeding. Apply moist heat to your breasts for a few minutes before feeding to help the milk flow and to help your breasts empty more easily. Challenge--Thrush R[place] Slot is a yeast infection that can form on your nipples, in your breast, or in your baby's mouth. It causes itching, soreness, burning or stabbing pain, and sometimes a rash.  Solution You will be given a medicated ointment for your nipples, and your baby will be given a liquid medicine for his or her mouth. It is important that you and your baby are treated at  the same time because thrush can be passed between you and your baby. Change disposable nursing pads often. Any bras, towels, or clothing  that come in contact with infected areas of your body or your baby's body need to be washed in very hot water every day. Wash your hands and your baby's hands often. All pacifiers, bottle nipples, or toys your baby puts in his or her mouth should be boiled once a day for 20 minutes. After 1 week of treatment, discard pacifiers and bottle nipples and buy new ones. All breast pump parts that touch the milk need to be boiled for 20 minutes every day. Challenge--Low Milk Supply You may not be producing enough milk if your baby is not gaining the proper amount of weight. Breast milk production is based on a supply-and-demand system. Your milk supply depends on how frequently and effectively your baby empties your breast. Solution The more you breastfeed and pump, the more breast milk you will produce. It is important that your baby empties at least one of your breasts at each feeding. If this is not happening, then use a breast pump or hand express any milk that remains. This will help to drain as much milk as possible at each feeding. It will also signal your body to produce more milk. If your baby is not emptying your breasts, it may be due to latching, sucking, or positioning problems. If low milk supply continues after addressing these issues, contact your health care provider or a lactation specialist as soon as possible. Challenge--Inverted or Flat Nipples Some women have nipples that turn inward instead of protruding outward. Other women have nipples that are flat. Inverted or flat nipples can sometimes make it more difficult for your baby to latch onto your breast. Solution You may be given a small device that pulls out inverted nipples. This device should be applied right before your baby is brought to your breast. You can also try using a breast pump for a short time before placing the baby at your breast. The pump can pull your nipple outwards to help your infant latch more easily. The baby's sucking  motion will help the inverted nipple protrude as well.  If you have flat nipples, encourage your baby to latch onto your breast and feed frequently in the early days after birth. This will give your baby practice latching on correctly while your breast is still soft. When your milk supply increases, between the second and fifth day after birth and your breasts become full, your baby will have an easier time latching.  Contact a lactation consultant if you still have concerns. She or he can teach you additional techniques to address breastfeeding problems related to nipple shape and position.  FOR MORE INFORMATION [employer]: www.llli.org   This information is not intended to replace advice given to you by your health care provider. Make sure you discuss any questions you have with your health care provider.   Document Released: 03/15/2006 Document Revised: 10/12/2014 Document Reviewed: 03/17/2013 Elsevier Interactive Patient Education 2016 [employer].  Breastfeeding and Inducing Lactation Induced lactation is using hormones or other medicines and breast stimulation to help you produce breast milk. You may want to try induced lactation if you:  Are adopting a baby.   Are having a surrogate mother carry your baby.   Have to stop breastfeeding for a period of time.  HOW DOES IT WORK? During pregnancy, your hormones change to prepare your body to produce  breast milk. After pregnancy, hormones signal your body to start making breast milk to feed your baby. When you do not go through these changes, it may be hard for you to produce enough breast milk to feed your baby. Your health care provider and a lactation consultant can help you produce milk using medicine and breast stimulation.  WILL I MAKE ENOUGH MILK TO FEED MY BABY? Induced lactation may be successful. However, very few women who use induced lactation to produce breast milk can make all the milk their baby needs.  You may need to feed your baby with donated breast milk or infant formula in addition to your breast milk. This will make sure your baby gets adequate nutrition. Induced lactation is usually more successful if you have been pregnant before.  HOW DOES MY BODY PRODUCE MILK? To induce lactation, you will start taking hormones 3-4 months before you want to start breastfeeding. You will continue to take them until about 6 weeks before the baby arrives. When you stop taking the hormones, you will need to perform breast stimulation a number of times per day to encourage breast milk production. Breast stimulation can be performed by gently rubbing and stretching the nipple tissue. Breast stimulation can also be performed using a double electric hospital-grade pump to mimic a baby's suckling at the breast. Try to pump every 3 hours (8 times a day) for 20 minutes on each breast. If you are unable to pump that many times each day, do it as often as possible. This helps your body continue to make milk. When you put your baby to your breast, your body may also respond to the smell, sound, and feel of your baby by increasing the amount of milk you produce.  Your health care provider may also prescribe medicine to stimulate lactation and increase your milk supply. Herbal medicines are also available to try to induce lactation. It is important to know that these medicines are not approved or regulated by the FDA. Always check with your health care provider before using any herbal medicine. WHAT ELSE DO I NEED TO KNOW?  You should only take hormones and medicines as directed by your health care provider or trained lactation consultant.  Talk to your health care provider or lactation consultant if you need guidance. He or she may be able to help you start a milk supply and advise you as you make important decisions about nourishing your baby.  Try using a supplemental nursing system to provide extra donated breast milk or  formula at the breast while the baby nurses. This ensures that your infant gets enough nutrition while you are breastfeeding. Ask a lactation specialist to help you find and use this device.   This information is not intended to replace advice given to you by your health care provider. Make sure you discuss any questions you have with your health care provider.   Document Released: 09/21/2005 Document Revised: 09/26/2013 Document Reviewed: 07/14/2013 Elsevier Interactive Patient Education [employer].  During breastfeeding, your body burns about 400-500 calories each day. It is important that you replace these burned calories by consuming a variety of healthy foods. There is no need to follow a special diet while breastfeeding your baby. Focus on making healthy choices to help support and maintain your milk production. To maintain a good milk supply and stay nourished yourself, it is also important that you drink plenty of water. WHAT DO I NEED TO KNOW ABOUT EATING DURING BREASTFEEDING? Medicines  Continue to take your prenatal vitamins and any other supplements as directed by your health care provider.  Talk with your health care provider about any medicines that you are taking. Certain medicines may slow or delay your milk production and may be harmful to your baby. Amount and Variety  Consume about 500 extra calories each day to maintain your milk supply.  Drink plenty of water, at least 64 oz per day or as directed by your health care provider. Try to drink at least 8 oz of water each time that you breastfeed. It is best to drink water before you feel thirsty. Your urine should be clear or pale yellow in color. If you notice that your urine is dark yellow, drink more water.  Continue to follow a well-balanced, healthy diet that includes healthy snacks. The taste of your milk will be affected by what you eat. Eating different foods will expose your baby to different tastes, which may  help your baby to accept solid foods more easily later on. What to Limit or Avoid  Limit your overall intake of foods that have "empty calories." These are foods that have little nutritional value, such as sweets, desserts, candies, sugar-sweetened beverages, and fried foods.  Avoid drinking large amounts of caffeine or alcohol.  Avoid drinking more than 2-3 cups (16-24 oz) of caffeinated drinks in a day. Caffeine is dehydrating. It will also enter your breast milk, and this might bother your baby or interfere with his or her sleep.  Avoid drinking more than one alcoholic drink per day, such as a 5-oz glass of wine, one 12-oz beer, or one standard cocktail. It may be best to wait to have your first alcoholic drink until your breastfeeding has been well established, which is usually after 2-3 months. After you have an alcoholic drink, wait at least 4 hours before you breastfeed. As an alternative, you may pump (express) your breast milk before you drink alcohol. Then, you can feed that milk to your baby at a later time.  Certain foods may cause you or your baby to have increased gas and may cause fussiness in your baby. If you notice increased gas or fussiness in your baby when you eat these foods, you may want to avoid them while breastfeeding. These may include:  Chocolate.  Spicy foods.  Vegetables such as broccoli, cauliflower, cabbage, onions, or Brussels sprouts. Food Safety  To prevent foodborne illness, practice good food safety and cleanliness, such as washing your hands before you eat and after you prepare raw meat. WHAT FOODS CAN I EAT? Grains All grains are okay to eat. Try to choose whole grains, such as whole wheat bread, oatmeal, or brown rice. Vegetables All vegetables are okay to eat. Try to eat a variety of colors and types of vegetables to get a full range of vitamins and minerals. Remember to wash your vegetables well before eating. Fruits All fruits are okay to eat. Try  to eat a variety of colors and types of fruit to get a full range of vitamins and minerals. Remember to wash your fruits well before eating. Meats and Other Protein Sources Lean meats are okay to eat. Try to eat chicken, [place], fish, and lean cuts of beef, veal, or pork. If you choose fish, choose fish that are low in mercury, such as salmon, canned light tuna, and catfish. You can also eat seafood such as shrimp, crab, and lobster. Other good protein sources include tofu, tempeh, beans, eggs, peanut butter, and  other nut butters. Dairy Dairy is okay to eat. Continue to drink milk and milk alternatives, or eat yogurt, cheese, cottage cheese, or sour cream. Beverages Most beverages are okay. Condiments All condiments are okay. Sweets and Desserts All sweets and desserts are okay. Fats and Oils All fats and oils are okay. The items listed above may not be a complete list of recommended foods or beverages. Contact your dietitian for more options. WHAT FOODS ARE NOT RECOMMENDED? Meats and Other Protein Sources Avoid eating fish with high mercury content, such as tilefish, shark, swordfish, and king mackerel. Beverages Avoid drinking sugar-sweetened beverages such as sodas, teas, or energy drinks. Limit the amount of caffeine and alcohol that you drink. The items listed above may not be a complete list of foods and beverages to avoid. Contact your dietitian for more information.   This information is not intended to replace advice given to you by your health care provider. Make sure you discuss any questions you have with your health care provider.   Document Released: 05/25/2014 Document Reviewed: 05/25/2014 Elsevier Interactive Patient Education [employer].

## 2015-08-11 ENCOUNTER — Ambulatory Visit: Payer: Self-pay

## 2015-08-11 NOTE — Lactation Note (Signed)
This note was copied from the chart of Chelsea Dorsey. Lactation Consultation Note  Patient Name: Chelsea Dorsey ERDEY'C Date: 08/11/2015   Was asked to see patient by Dr. Tamera Punt in regards to pump rental prior to D/C. Infant/mom was discharged prior to being seen by [facility].     Maternal Data    Feeding    LATCH Score/Interventions                      Lactation Tools Discussed/Used     Consult Status      Donn Pierini 08/11/2015, 12:19 PM

## 2015-08-13 ENCOUNTER — Encounter: Payer: Medicaid Other | Admitting: Advanced Practice Midwife

## 2015-09-05 ENCOUNTER — Telehealth: Payer: Self-pay | Admitting: Obstetrics & Gynecology

## 2015-09-05 NOTE — Telephone Encounter (Signed)
Pt delivered 08/09/2015 SVD wants to return to work now. Please advise.

## 2015-09-05 NOTE — Telephone Encounter (Signed)
Pt called stating that she would like a note for work stating that she is able to return. Please contact pt

## 2015-09-05 NOTE — Telephone Encounter (Signed)
OK to return to work 

## 2015-09-05 NOTE — Telephone Encounter (Signed)
Pt informed return to work note left at the front desk for pick up.

## 2015-09-12 ENCOUNTER — Ambulatory Visit: Payer: Medicaid Other | Admitting: Advanced Practice Midwife

## 2015-09-19 ENCOUNTER — Encounter: Payer: Self-pay | Admitting: Adult Health

## 2015-09-19 ENCOUNTER — Ambulatory Visit (INDEPENDENT_AMBULATORY_CARE_PROVIDER_SITE_OTHER): Payer: Medicaid Other | Admitting: Adult Health

## 2015-09-19 NOTE — Progress Notes (Signed)
Patient ID: Chelsea Dorsey, female   DOB: 18-Jan-1991, 24 y.o.   MRN: [id number] [place] is a 24 year old white female, in for a postpartum exam.She has no complaints, she says she had no stitches and epi dural only worked on one side.  Delivery Date:08/09/15 Method of Delivery: Vaginal delivery baby girl, [name] Rayne, 7 lbs 15.6 oz, Manus Gunning delivered her   Sexual Activity since delivery: Yes twice with condom, last time 12/10  Method of Feeding: Bottle   Number of weeks bleeding post delivery: 1 week  Review of Systems: Patient denies any headaches, hearing loss, fatigue, blurred vision, shortness of breath, chest pain, abdominal pain, problems with bowel movements, urination, or intercourse. No joint pain or mood swings. Reviewed past medical,surgical, social and family history. Reviewed medications and allergies.   Depression Score: 0 BP 120/80 mmHg  Pulse 68  Ht 5\' 3"  (1.6 m)  Wt 228 lb (103.42 kg)  BMI 40.40 kg/m2  LMP 09/11/2015  Breastfeeding? No Pelvic Exam:   External genitalia is normal in appearance, no lesions.  The vagina has good color, moisture and rugae, no lesions.Urethra has no masses or tenderness noted. The cervix is bulbous.  Uterus is felt to be normal size, shape, and contour, well involuted.  No adnexal masses or tenderness noted.Bladder is non tender, no masses felt.She wants nexplanon and it is here.   Impression:  Status post delivery, post partum check, depression screening   Plan:   No sex for 2 weeks Return 12/29 for stat [facility] in am and nexplanon insertion with Manus Gunning

## 2015-09-19 NOTE — Patient Instructions (Signed)
No sex for 2 weeks Return in 2 weeks for stat [facility] in am and then see Manus Gunning in pm for nexplanon insertion

## 2015-10-03 ENCOUNTER — Other Ambulatory Visit: Payer: Medicaid Other

## 2015-10-03 ENCOUNTER — Encounter: Payer: Medicaid Other | Admitting: Advanced Practice Midwife

## 2015-10-09 ENCOUNTER — Encounter: Payer: Self-pay | Admitting: Advanced Practice Midwife

## 2015-10-09 ENCOUNTER — Ambulatory Visit (INDEPENDENT_AMBULATORY_CARE_PROVIDER_SITE_OTHER): Payer: Medicaid Other | Admitting: Advanced Practice Midwife

## 2015-10-09 ENCOUNTER — Other Ambulatory Visit: Payer: Medicaid Other

## 2015-10-09 VITALS — BP 120/90 | HR 72 | Ht 62.0 in | Wt 234.0 lb

## 2015-10-09 DIAGNOSIS — Z3040 Encounter for surveillance of contraceptives, unspecified: Secondary | ICD-10-CM

## 2015-10-09 DIAGNOSIS — Z3049 Encounter for surveillance of other contraceptives: Secondary | ICD-10-CM | POA: Diagnosis not present

## 2015-10-09 DIAGNOSIS — Z30017 Encounter for initial prescription of implantable subdermal contraceptive: Secondary | ICD-10-CM | POA: Insufficient documentation

## 2015-10-09 LAB — BETA HCG QUANT (REF LAB): hCG Quant: <1 m[IU]/mL

## 2015-10-09 NOTE — Progress Notes (Signed)
  HPI:  Chelsea Dorsey is a 25 y.o. year old Caucasian female here for Nexplanon insertion.  Her last sex was >3 weeks ago , and her pregnancy test today was negative.  She started her period yesterday.  Risks/benefits/side effects of Nexplanon have been discussed and her questions have been answered.  Specifically, a failure rate of 10/998 has been reported, with an increased failure rate if pt takes [place] and/or antiseizure medicaitons.  Lochlyn Klindt is aware of the common side effect of irregular bleeding, which the incidence of decreases over time.   Past Medical History: Past Medical History  Diagnosis Date  . UTI (lower urinary tract infection)   . Pregnant 12/10/2014  . Medical history non-contributory     Past Surgical History: Past Surgical History  Procedure Laterality Date  . No past surgeries      Family History: History reviewed. No pertinent family history.  Social History: Social History  Substance Use Topics  . Smoking status: Current Every Day Smoker -- 0.00 packs/day for 5 years    Types: Cigarettes  . Smokeless tobacco: Never Used     Comment: smokes 2 cig daily  . Alcohol Use: No    Allergies:  Allergies  Allergen Reactions  . Penicillins Hives    Has patient had a PCN reaction causing immediate rash, facial/tongue/throat swelling, SOB or lightheadedness with hypotension: No Has patient had a PCN reaction causing severe rash involving mucus membranes or skin necrosis: No Has patient had a PCN reaction that required hospitalization yes Has patient had a PCN reaction occurring within the last 10 years: No If all of the above answers are "NO", then may proceed with Cephalosporin use.      Her left arm, approximatly 4 inches proximal from the elbow, was cleansed with alcohol and anesthetized with 2cc of 2% Lidocaine.  The area was cleansed again and the Nexplanon was inserted without difficulty.  A pressure bandage was applied.  Pt was  instructed to remove pressure bandage in a few hours, and keep insertion site covered with a bandaid for 3 days..  Follow-up scheduled PRN problems  CRESENZO-DISHMAN,Abdias Hickam 10/09/2015 4:12 PM

## 2015-10-15 NOTE — Progress Notes (Signed)
Post discharge chart review completed.  

## 2016-01-18 ENCOUNTER — Ambulatory Visit (HOSPITAL_COMMUNITY)
Admission: EM | Admit: 2016-01-18 | Discharge: 2016-01-18 | Disposition: A | Discharge status: Home / Self Care | Payer: No Typology Code available for payment source | Attending: Emergency Medicine | Admitting: Emergency Medicine

## 2016-01-18 ENCOUNTER — Encounter (HOSPITAL_COMMUNITY): Payer: Self-pay | Admitting: *Deleted

## 2016-01-18 DIAGNOSIS — S0512XA Contusion of eyeball and orbital tissues, left eye, initial encounter: Secondary | ICD-10-CM

## 2016-01-18 MED ORDER — ATROPINE SULFATE 1 % OP SOLN: 1.0000 [drp] | Freq: Three times a day (TID) | OPHTHALMIC | Status: DC

## 2016-01-18 MED ORDER — PREDNISOLONE ACETATE 1 % OP SUSP: 1.0000 [drp] | Freq: Four times a day (QID) | OPHTHALMIC | Status: DC
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
## 2016-01-18 NOTE — Discharge Instructions (Signed)
Hyphema °Hyphema is bleeding in the eye. This may occur in the front of the eye between the clear covering of the eye (cornea) and the colored part of the eye (iris). You may be able to see the blood in the front part of your eye. A hyphema may be large or small. °Hyphema may be painful and can affect your vision. Treatment is important to prevent permanent loss of vision. °CAUSES  °Eye injury, such as a blow to your eye or upper part of your face, is the most common cause of this condition. Eye surgery can also cause this condition. Other less common causes include: °· Abnormal blood vessels that form in the iris. °· Eye infections. °· Blood clotting disorders. °· Artificial lenses used after cataract surgery. °· Eye cancer. °RISK FACTORS °This condition is more likely to occur in people who play sports, especially sports that use small balls. You may also be more likely to develop this condition if you: °· Have a disease that prevents normal blood clotting, such as hemophilia. °· Take certain medicines that thin your blood, such as aspirin. °· Have diabetes. °· Had recent eye surgery. °· Have sickle cell anemia. °SYMPTOMS  °The most common symptom of this condition is a pool of blood in the front of your eye. The blood may appear red or black. A very small hyphema may not be visible. A large hyphema may fill part or all of the front part of your eye. Symptoms may also include:  °· Blurred vision or vision loss. °· Pain. °· Sensitivity to bright light. °DIAGNOSIS  °This condition is diagnosed with a medical history and physical exam. You may have a blood test to check for a bleeding disorder or sickle cell disease. You may also have an eye exam done by an eye specialist (ophthalmologist). This may include:  °· Checking your eye with a type of microscope (slit lamp). °· A vision test. °· Measuring the pressure in your eye. °TREATMENT  °Treatment depends on the severity of the condition. Many hyphemas go away on  their own. Your health care provider will monitor your hyphema closely until it goes away completely. Treatment may also include:  °· Restricted activity or bed rest with your head elevated. °· Wearing a cover over your eye (eye shield) to protect it from further injury. °· Stopping all medicines that can increase bleeding, such as aspirin. Only do this as told by your health care provider. °· Eye drops or medicines taken by mouth to control swelling and pressure in your eye. °Eye surgery may be needed to remove the hyphema if other treatments do not help. °HOME CARE INSTRUCTIONS  °· Rest in bed as told by your health care provider. Lie on your back and use extra pillows to keep your head raised. °· Take over-the-counter and prescription medicines only as told by your health care provider. °· Wear your eye shield as told by your health care provider. °· Do not bend forward or lower your head until your health care provider approves. °· Do not lift anything that is heavier than 10 lb (4.5 kg) until your health care provider approves. °· Keep all follow-up visits as told by your health care provider. This is important. °PREVENTION  °It is important to always wear eye protection when you are doing any activity that can result in eye injury. °SEEK MEDICAL CARE IF:  °· You develop pain in the affected eye. °· Your vision is not improving. °· The amount of   blood in your eye does not decrease after several days. °SEEK IMMEDIATE MEDICAL CARE IF:  °· Your vision gets worse. °· The amount of blood in your eye increases. °· You feel nauseous or vomit. °  °This information is not intended to replace advice given to you by your health care provider. Make sure you discuss any questions you have with your health care provider. °  °Document Released: 12/28/2000 Document Revised: 06/12/2015 Document Reviewed: 02/13/2015 °Elsevier Interactive Patient Education ©2016 [employer] ° °
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
## 2016-12-09 IMAGING — US US OB COMP LESS 14 WK
1 series · 14 of 28 positions shown · non-contrast
Comparison: [HOSPITAL] OB-GYN ultrasound dated 12/17/2014

CLINICAL DATA: Pregnant, vaginal bleeding

EXAM:
OBSTETRIC <14 WK ULTRASOUND
TECHNIQUE: Transabdominal ultrasound was performed for evaluation of the
gestation as well as the maternal uterus and adnexal regions.

[Series 1: us ob comp less 14 wk · 14 of 40 slices shown]
[im 2/40]
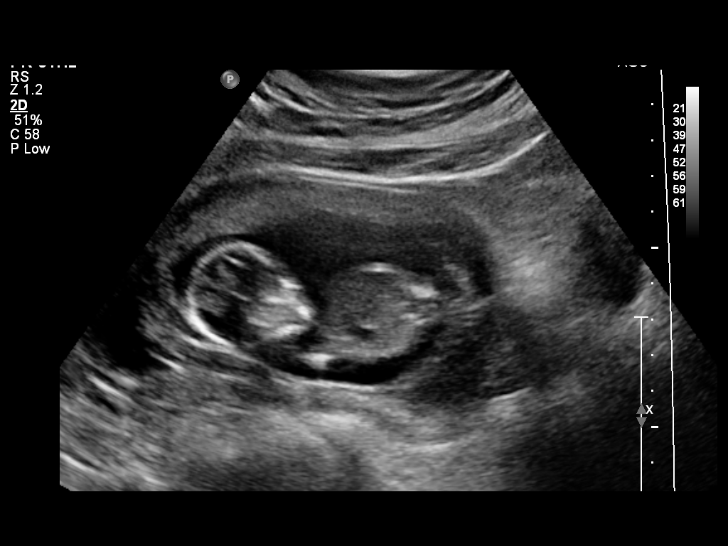
[im 5/40]
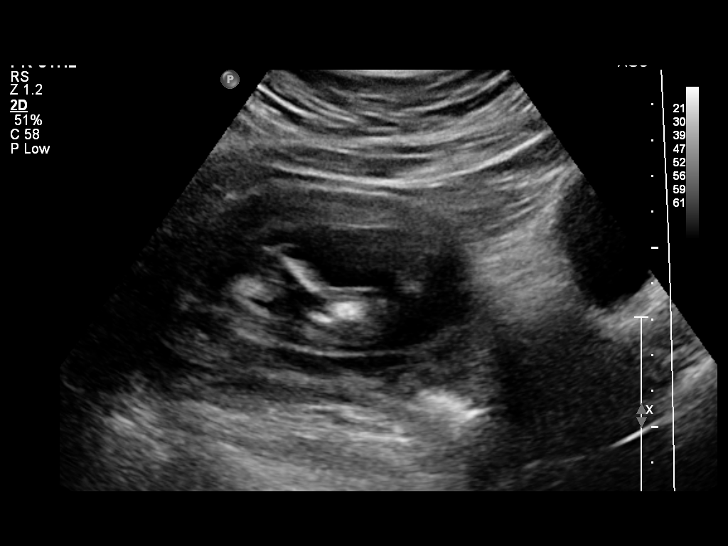
[im 8/40]
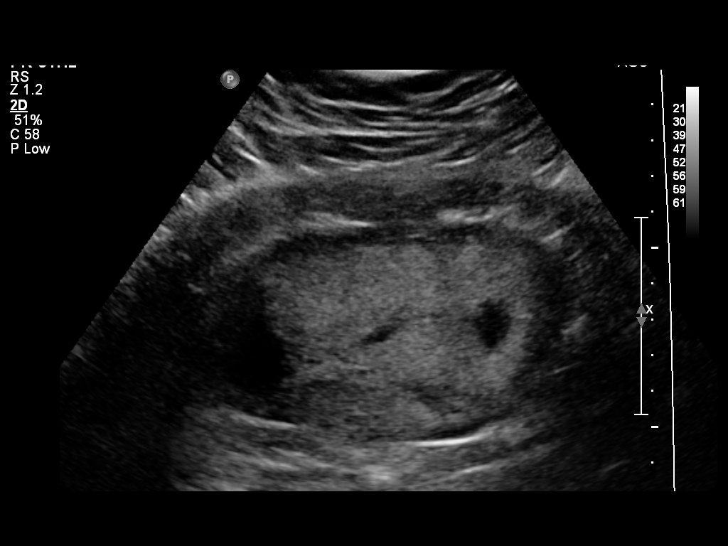
[im 11/40]
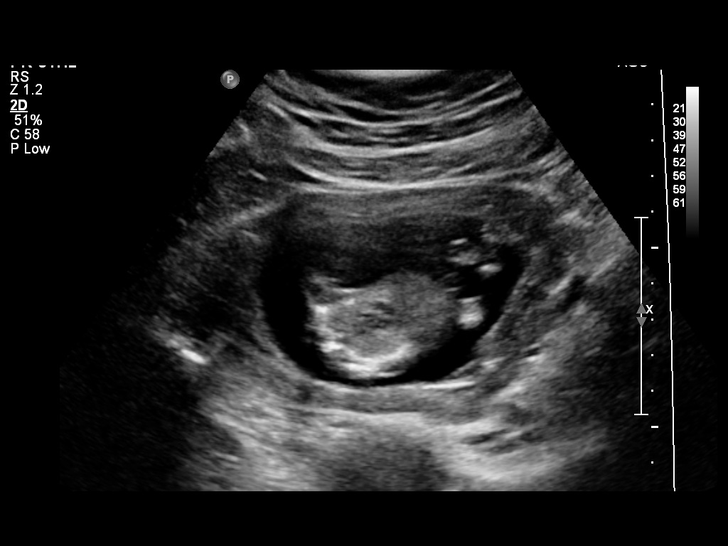
[im 14/40]
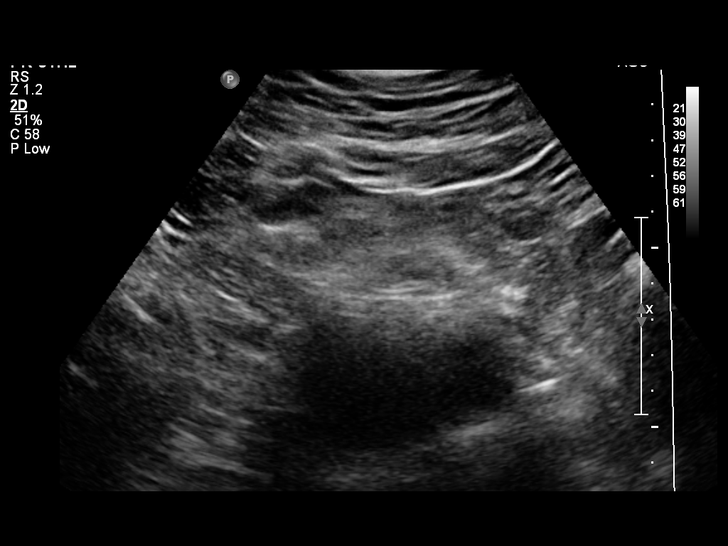
[im 16/40]
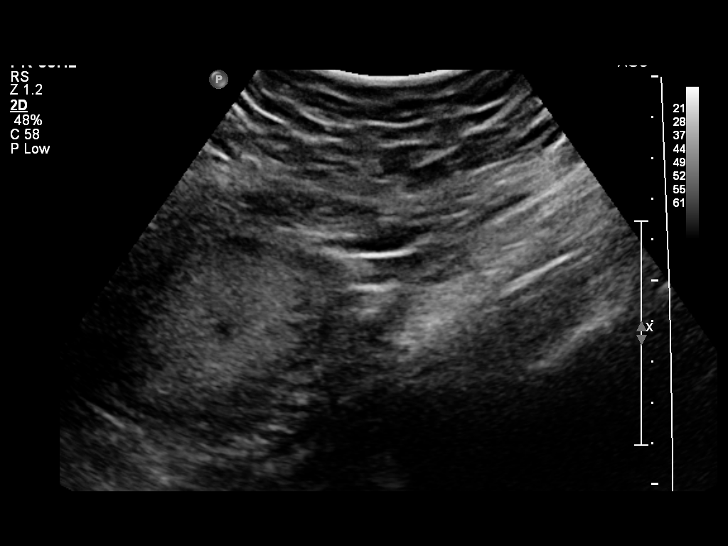
[im 19/40]
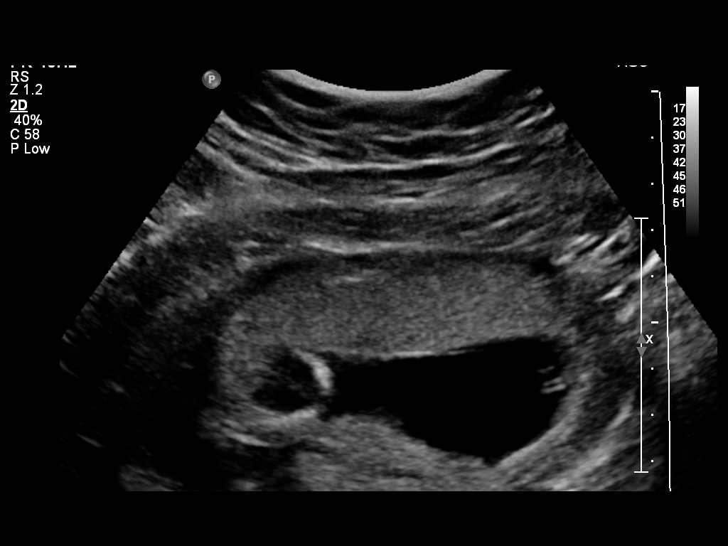
[im 22/40]
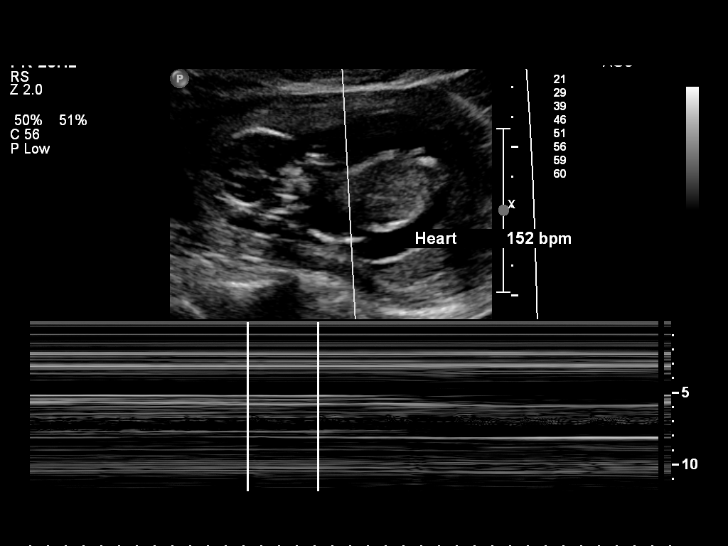
[im 25/40]
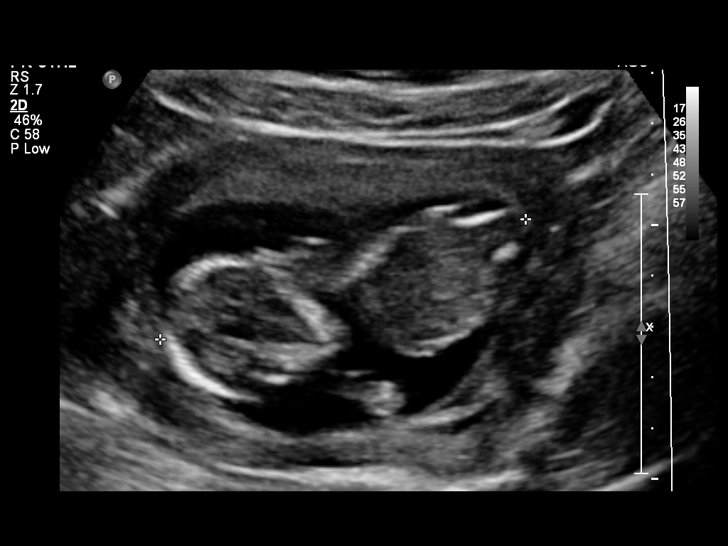
[im 28/40]
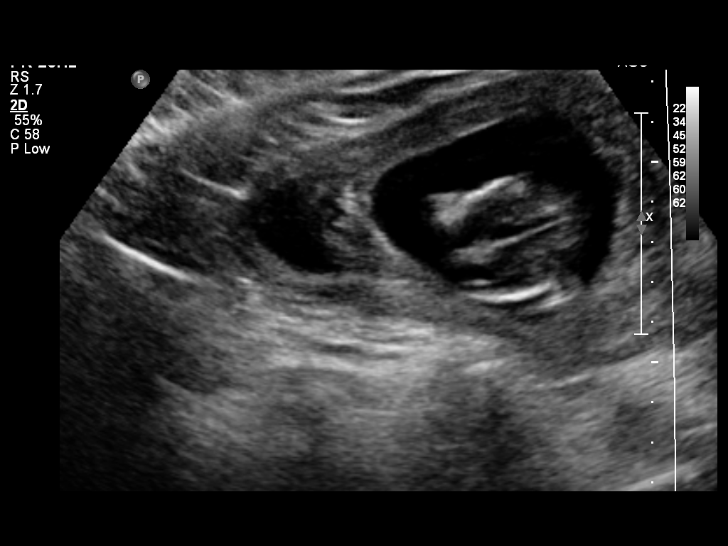
[im 31/40]
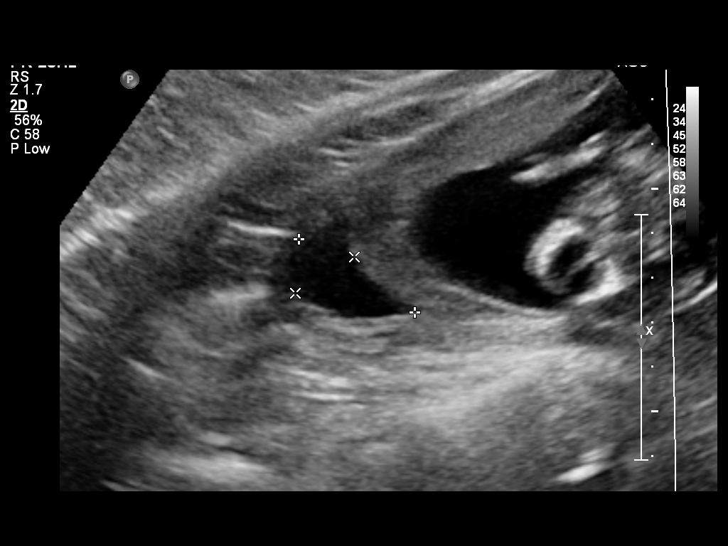
[im 34/40]
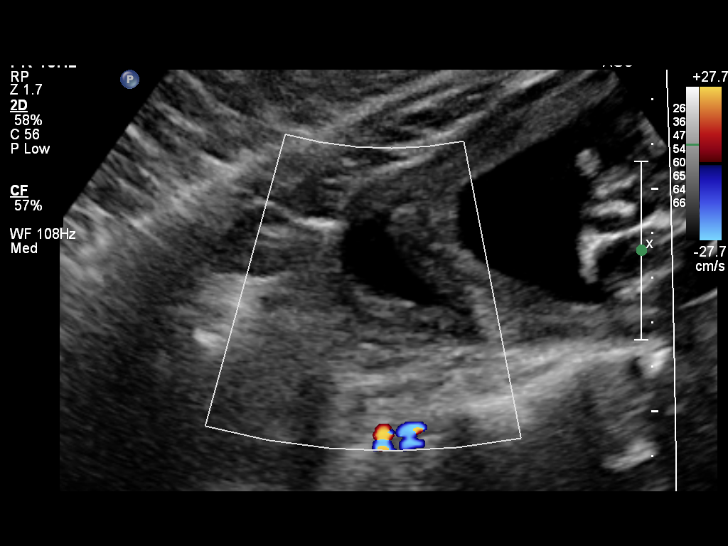
[im 37/40]
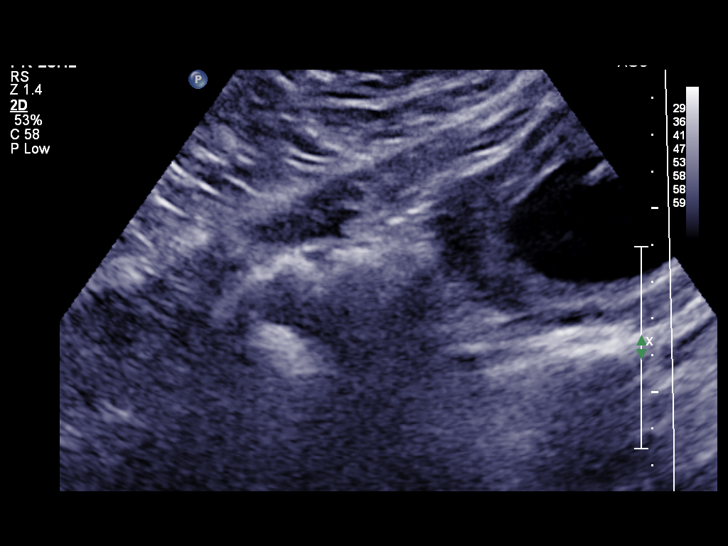
[im 40/40]
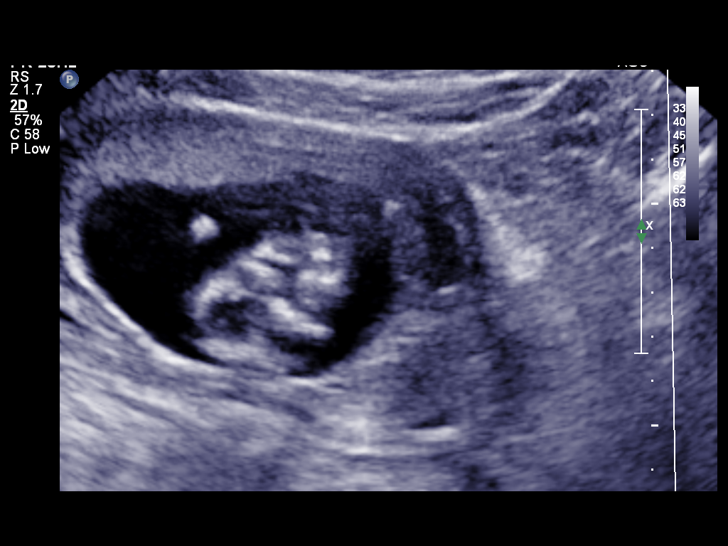

[14 of 28 positions shown; findings below may reference images not displayed]

FINDINGS: Intrauterine gestational sac: Visualized/normal in shape.

Yolk sac:  Not visualized

Embryo:  Present

Cardiac Activity: Present

Heart Rate: 152 bpm

CRL:   75.6  mm   13 w 5 d                  US EDC: 08/11/2015

Maternal uterus/adnexae: Small subchronic hemorrhage.

Two uterine fibroids measuring up to 4.2 cm.

Right ovary is within normal limits. Left ovary is not discretely
visualized.

No free fluid.
IMPRESSION: Single live intrauterine gestation with estimated gestational age 13
weeks 5 days by crown-rump length.

## 2017-04-28 ENCOUNTER — Telehealth: Payer: Self-pay | Admitting: Family Medicine

## 2017-04-28 NOTE — Telephone Encounter (Signed)
Records requested from [id number] 04/28/17

## 2017-05-07 NOTE — Telephone Encounter (Signed)
[facility] reports they have not seen this patient at their practice. Chelsea Dorsey

## 2018-10-27 ENCOUNTER — Encounter: Payer: Self-pay | Admitting: Advanced Practice Midwife

## 2020-02-29 ENCOUNTER — Encounter: Payer: Self-pay | Admitting: Advanced Practice Midwife

## 2021-10-14 ENCOUNTER — Other Ambulatory Visit: Payer: Self-pay

## 2021-10-14 ENCOUNTER — Ambulatory Visit (INDEPENDENT_AMBULATORY_CARE_PROVIDER_SITE_OTHER): Payer: Medicaid Other | Admitting: Adult Health

## 2021-10-14 ENCOUNTER — Other Ambulatory Visit (HOSPITAL_COMMUNITY)
Admission: RE | Admit: 2021-10-14 | Discharge: 2021-10-14 | Disposition: A | Discharge status: Home / Self Care | Payer: Medicaid Other | Source: Ambulatory Visit | Attending: Adult Health | Admitting: Adult Health

## 2021-10-14 ENCOUNTER — Encounter: Payer: Self-pay | Admitting: Adult Health

## 2021-10-14 VITALS — BP 127/90 | HR 76 | Ht 62.5 in | Wt 218.5 lb

## 2021-10-14 DIAGNOSIS — Z3009 Encounter for other general counseling and advice on contraception: Secondary | ICD-10-CM | POA: Insufficient documentation

## 2021-10-14 DIAGNOSIS — Z113 Encounter for screening for infections with a predominantly sexual mode of transmission: Secondary | ICD-10-CM | POA: Diagnosis not present

## 2021-10-14 DIAGNOSIS — Z01419 Encounter for gynecological examination (general) (routine) without abnormal findings: Secondary | ICD-10-CM | POA: Insufficient documentation

## 2021-10-14 DIAGNOSIS — Z0000 Encounter for general adult medical examination without abnormal findings: Secondary | ICD-10-CM | POA: Diagnosis present

## 2021-10-14 NOTE — Progress Notes (Signed)
Patient ID: Chelsea Dorsey, female   DOB: 1991-01-21, 31 y.o.   MRN: [id number] History of Present Illness: Chelsea Dorsey is a 31 year old white female,single, G1P1 in for family Planning pap and physical. She has an expired nexplanon that she wants removed.    Current Medications, Allergies, Past Medical History, Past Surgical History, Family History and Social History were reviewed in [employer] record.     Review of Systems: Patient denies any headaches, hearing loss, fatigue, blurred vision, shortness of breath, chest pain, abdominal pain, problems with bowel movements, urination, or intercourse. No joint pain or mood swings.  Has pain left side at times    Physical Exam:BP 127/90 (BP Location: Right Arm, Patient Position: Sitting, Cuff Size: Large)    Pulse 76    Ht 5' 2.5" (1.588 m)    Wt 218 lb 8 oz (99.1 kg)    LMP 10/12/2021    BMI 39.33 kg/m   General:  Well developed, well nourished, no acute distress Skin:  Warm and dry Neck:  Midline trachea, normal thyroid, good ROM, no lymphadenopathy Lungs; Clear to auscultation bilaterally Breast:  No dominant palpable mass, retraction, or nipple discharge,has bilateral inverted nipples  Cardiovascular: Regular rate and rhythm Abdomen:  Soft, non tender, no hepatosplenomegaly Pelvic:  External genitalia is normal in appearance, no lesions.  The vagina is normal in appearance,+period blood. Urethra has no lesions or masses. The cervix is bulbous.Pap with GC/CHL and HR HPV genotyping performed.  Uterus is felt to be normal size, shape, and contour.  No adnexal masses or tenderness noted.Bladder is non tender, no masses felt. Rectal: Deferred  Extremities/musculoskeletal:  No swelling or varicosities noted, no clubbing or cyanosis Psych:  No mood changes, alert and cooperative,seems happy AA is 1 Fall risk is low Depression screen PHQ 2/9 10/14/2021  Decreased Interest 2  Down, Depressed, Hopeless 3  PHQ - 2 Score 5   Altered sleeping 3  Tired, decreased energy 2  Change in appetite 3  Feeling bad or failure about yourself  2  Trouble concentrating 2  Moving slowly or fidgety/restless 0  Suicidal thoughts 0  PHQ-9 Score 17    GAD 7 : Generalized Anxiety Score 10/14/2021  Nervous, Anxious, on Edge 2  Control/stop worrying 2  Worry too much - different things 2  Trouble relaxing 2  Restless 1  Easily annoyed or irritable 3  Afraid - awful might happen 3  Total GAD 7 Score 15      Upstream - 10/14/21 1018       Pregnancy Intention Screening   Does the patient want to become pregnant in the next year? No    Does the patient's partner want to become pregnant in the next year? No    Would the patient like to discuss contraceptive options today? Yes      Contraception Wrap Up   Current Method Hormonal Implant;Female Condom    End Method Hormonal Implant;Female Condom    Contraception Counseling Provided Yes             Examination chaperoned by Levy Pupa LPN  Impression and plan: 1. Routine general medical examination at a health care facility Pap sent  - Cytology - PAP( [facility])  2. Family planning  - Cytology - PAP( [place]) - HIV Antibody (routine testing w rflx) - [facility]  3. Screening examination for STD (sexually transmitted disease) GC/CHL on pap - HIV Antibody (routine testing w rflx) - [facility] - Hepatitis C  antibody  4. Encounter for gynecological examination with Papanicolaou smear of cervix Pap sent  Physical in 1 year Pap in 3 if normal   5. General counseling and advice for contraceptive management Discussed options and she wants IUD  No sex Return 10/31/21 for nexplanon removal and IUD insertion Mirena handout given

## 2021-10-15 LAB — RPR: RPR Ser Ql: NONREACTIVE

## 2021-10-15 LAB — HEPATITIS C ANTIBODY: Hep C Virus Ab: <0.1 s/co ratio (ref 0.0–0.9)

## 2021-10-15 LAB — HIV ANTIBODY (ROUTINE TESTING W REFLEX): HIV Screen 4th Generation wRfx: NONREACTIVE

## 2021-10-20 LAB — CYTOLOGY - PAP
Chlamydia: NEGATIVE
Comment: NEGATIVE
Comment: NEGATIVE
Comment: NORMAL
Diagnosis: NEGATIVE
High risk HPV: NEGATIVE
Neisseria Gonorrhea: NEGATIVE

## 2021-10-31 ENCOUNTER — Encounter: Payer: Self-pay | Admitting: Adult Health

## 2021-10-31 ENCOUNTER — Other Ambulatory Visit: Payer: Self-pay

## 2021-10-31 ENCOUNTER — Ambulatory Visit (INDEPENDENT_AMBULATORY_CARE_PROVIDER_SITE_OTHER): Payer: Medicaid Other | Admitting: Adult Health

## 2021-10-31 VITALS — BP 125/85 | HR 78 | Ht 62.0 in | Wt 222.0 lb

## 2021-10-31 DIAGNOSIS — Z3202 Encounter for pregnancy test, result negative: Secondary | ICD-10-CM | POA: Diagnosis not present

## 2021-10-31 DIAGNOSIS — Z3046 Encounter for surveillance of implantable subdermal contraceptive: Secondary | ICD-10-CM | POA: Diagnosis not present

## 2021-10-31 DIAGNOSIS — Z30430 Encounter for insertion of intrauterine contraceptive device: Secondary | ICD-10-CM | POA: Insufficient documentation

## 2021-10-31 HISTORY — DX: Encounter for insertion of intrauterine contraceptive device: Z30.430

## 2021-10-31 LAB — POCT URINE PREGNANCY: Preg Test, Ur: NEGATIVE

## 2021-10-31 MED ORDER — LEVONORGESTREL 20 MCG/DAY IU IUD
1.0000 | INTRAUTERINE_SYSTEM | Freq: Once | INTRAUTERINE | Status: AC
  Administered 2021-10-31: 1 via INTRAUTERINE

## 2021-10-31 NOTE — Addendum Note (Signed)
Addended by: Dorita Sciara, Frieda Arnall A on: 10/31/2021 12:22 PM   Modules accepted: Orders

## 2021-10-31 NOTE — Progress Notes (Signed)
Patient ID: Chelsea Dorsey, female   DOB: 1991/02/26, 31 y.o.   MRN: [id number]   IUD INSERTION Patient name: Chelsea Dorsey MRN [id number]  Date of birth: 1990-10-21 Subjective Findings:   Chelsea Dorsey is a 31 y.o. G54P1001 Caucasian female being seen today for insertion of a Mirena IUD. And nexplanon removal, it has expired. Patient's last menstrual period was 10/12/2021 (exact date). Last sexual intercourse was 10/10/21  Last pap1/10/23. Results were:  Lab Results  Component Value Date   DIAGPAP  10/14/2021    - Negative for intraepithelial lesion or malignancy (NILM)   HPVHIGH Negative 10/14/2021     The risks and benefits of the method and placement have been thouroughly reviewed with the patient and all questions were answered.  Specifically the patient is aware of failure rate of 10/998, expulsion of the IUD and of possible perforation.  The patient is aware of irregular bleeding due to the method and understands the incidence of irregular bleeding diminishes with time.  Signed copy of informed consent in chart.   Depression screen PHQ 2/9 10/14/2021  Decreased Interest 2  Down, Depressed, Hopeless 3  PHQ - 2 Score 5  Altered sleeping 3  Tired, decreased energy 2  Change in appetite 3  Feeling bad or failure about yourself  2  Trouble concentrating 2  Moving slowly or fidgety/restless 0  Suicidal thoughts 0  PHQ-9 Score 17     GAD 7 : Generalized Anxiety Score 10/14/2021  Nervous, Anxious, on Edge 2  Control/stop worrying 2  Worry too much - different things 2  Trouble relaxing 2  Restless 1  Easily annoyed or irritable 3  Afraid - awful might happen 3  Total GAD 7 Score 15     Pertinent History Reviewed:   Reviewed past medical,surgical, social, obstetrical and family history.  Reviewed problem list, medications and allergies. Objective Findings & Procedure:   Vitals:   10/31/21 1107  BP: 125/85  Pulse: 78  Weight: 222 lb (100.7 kg)  Height: 5\' 2"  (1.575  m)  Body mass index is 40.6 kg/m.  Results for orders placed or performed in visit on 10/31/21 (from the past 24 hour(s))  POCT urine pregnancy   Collection Time: 10/31/21 11:11 AM  Result Value Ref Range   Preg Test, Ur Negative Negative  Consent signed and time out called.  Left arm cleansed with betadine, and injected with 1.5 cc 2% lidocaine and waited til numb.Under sterile technique a #11 blade was used to make small vertical incision, and a curved forceps was used to easily remove rod. Steri strips applied. Pressure dressing applied.   Time out was performed for IUD.  A Graves speculum was placed in the vagina.  The cervix was visualized, prepped using Betadine, and grasped with a single tooth tenaculum. The uterus was found to be neutral and it sounded to 7 cm.  Mirena  IUD placed per manufacturer's recommendations. The strings were trimmed to approximately 3 cm. The patient tolerated the procedure well.   Informal transvaginal sonogram was performed and the proper placement of the IUD was verified.  Chaperone: Scientist, clinical (histocompatibility and immunogenetics) & Plan:   1) Mirena IUD insertion Lot [id number] Exp 2025/Jan  The patient was given post procedure instructions, including signs and symptoms of infection and to check for the strings after each menses or each month, and refraining from intercourse or anything in the vagina for 3 days. She was given a care card with date IUD placed, and date  IUD to be removed. She is scheduled for a f/u appointment in 4 weeks. 2. Nexplanon removal - keep clean and dry x 24 hours, no heavy lifting, keep steri strips on x 72 hours, Keep pressure dressing on x 24 hours. Follow up prn problems.  Orders Placed This Encounter  Procedures   POCT urine pregnancy    Return in about 4 weeks (around 11/28/2021) for IUD check with me.  Derrek Monaco NP  10/31/2021 11:56 AM

## 2021-10-31 NOTE — Patient Instructions (Addendum)
-  keep clean and dry x 24 hours, no heavy lifting, keep steri strips on x 72 hours, Keep pressure dressing on x 24 hours. Follow up prn problems.  Nothing in vagina for 72 hours

## 2021-11-28 ENCOUNTER — Ambulatory Visit: Payer: Medicaid Other | Admitting: Obstetrics & Gynecology

## 2021-12-02 ENCOUNTER — Ambulatory Visit: Payer: Medicaid Other | Admitting: Adult Health

## 2022-11-09 ENCOUNTER — Telehealth: Payer: Self-pay

## 2022-11-09 NOTE — Telephone Encounter (Signed)
Mychart msg sent. AS, CMA 

## 2022-12-10 DIAGNOSIS — Z880 Allergy status to penicillin: Secondary | ICD-10-CM | POA: Diagnosis not present

## 2022-12-10 DIAGNOSIS — R1032 Left lower quadrant pain: Secondary | ICD-10-CM | POA: Diagnosis not present

## 2022-12-10 DIAGNOSIS — Z975 Presence of (intrauterine) contraceptive device: Secondary | ICD-10-CM | POA: Diagnosis not present

## 2022-12-10 DIAGNOSIS — R109 Unspecified abdominal pain: Secondary | ICD-10-CM | POA: Diagnosis not present

## 2022-12-10 NOTE — ED Provider Notes (Signed)
 [facility]  ED Provider Note  Chelsea Dorsey 32 y.o. female DOB: Mar 09, 1991 MRN: [id number] History   Chief Complaint  Patient presents with  . Abdominal Pain    Lower L abd pain that radiates up to L side of chest and down L arm since yesterday at 0400. Pt states she has also had back pain for 4 days. Pt denies any urinary symptoms. Pt also complains of diarrhea.   Patient comes in with complaints of left lower quadrant abdominal pain.  Symptoms started yesterday.  She has had some back pain for the last 4 days.  She has been taking ibuprofen  for this.  However, the abdominal pain just started yesterday and has progressively worsened.  She rates the pain as severe.  She has had some nausea, vomiting and diarrhea.  She denies blood in her stools.  She denies urinary symptoms.   History provided by:  Patient Abdominal Pain Associated symptoms: diarrhea, nausea and vomiting   Associated symptoms: no dysuria, no fever and no shortness of breath       History reviewed. No pertinent past medical history.  No past surgical history on file.  Social History   Substance and Sexual Activity  Alcohol Use None   Social History   Tobacco Use  Smoking Status Not on file  Smokeless Tobacco Not on file   E-Cigarettes  . Vaping Use    . Start Date    . Cartridges/Day    . Quit Date     Social History   Substance and Sexual Activity  Drug Use Not on file         Allergies  Allergen Reactions  . Penicillins Anaphylaxis    Home Medications   No medications on file    Review of Systems   Review of Systems  Constitutional:  Negative for fever.  Respiratory:  Negative for shortness of breath.   Cardiovascular:  Negative for palpitations.  Gastrointestinal:  Positive for abdominal pain, diarrhea, nausea and vomiting. Negative for blood in stool.  Endocrine: Negative for polyuria.  Genitourinary:  Negative for dysuria.   Musculoskeletal:  Positive for back pain.  Skin:  Negative for rash.  Neurological:  Negative for dizziness.  Hematological:  Does not bruise/bleed easily.    Physical Exam   ED Triage Vitals [12/10/22 0052]  BP (!) 186/128  Heart Rate 107  Resp 20  SpO2 98 %  Temp 97.8 F (36.6 C)    Physical Exam  Nursing note and vitals reviewed. Constitutional: She appears well-developed and well-nourished. She appears to be in pain.  HENT:  Head: Normocephalic and atraumatic.  Eyes: Conjunctivae are normal.  Cardiovascular: Regular rhythm and normal heart sounds. Tachycardia present.  Pulmonary/Chest: Respiratory effort normal and breath sounds normal.  Abdominal: Soft. There is moderate abdominal tenderness in the left lower quadrant. Bowel sounds are normal. There is no CVA tenderness.  Musculoskeletal: no edema.  Neurological: She is alert and oriented to person, place, and time. She has normal speech.  Skin: Skin is warm. Skin is dry.    ED Course   Lab results:   URINALYSIS ONLY - NO SYMPTOMS - Abnormal      Result Value   Urine Color Colorless (*)    Urine Clarity Clear     Urine Specific Gravity 1.004 (*)    Urine pH 6.0     Urine Protein - Dipstick Negative     Urine Glucose Negative     Urine  Ketones Negative     Urine Bilirubin Negative     Urine Blood 0.03 (*)    Urine Nitrite Negative     Urine Urobilinogen <2     Urine Leukocyte Esterase Negative     Urine Squamous Epithelial Cells 0-2     Urine WBC 0-2     Urine RBC 0-2     Urine Bacteria Trace     UA Microscopic Yes Micro (*)   CBC AND DIFFERENTIAL - Abnormal   WBC 12.9 (*)    RBC 5.23 (*)    HGB 16.6 (*)    HCT 48.0 (*)    MCV 92     MCH 31.7     MCHC 34.6     Plt Ct 231     RDW SD 40.9     MPV 11.7 (*)    NRBC% 0.0     NRBC 0.000     NEUTROPHIL % 58.5     LYMPHOCYTE % 34.5     MONOCYTE % 5.3     Eosinophil % 0.9 (*)    BASOPHIL % 0.4     IG% 0.400     ABSOLUTE NEUTROPHIL COUNT 7.59 (*)     ABSOLUTE LYMPHOCYTE COUNT 4.5     Mono Absolute 0.7     EOS ABSOLUTE 0.1     BASO ABSOLUTE 0.1     IG ABSOLUTE 0.050 (*)   COMPREHENSIVE METABOLIC PANEL - Abnormal   Na 137     Potassium 3.8     Cl 101     CO2 24     AGAP 12     Glucose 103 (*)    BUN 11     Creatinine 0.69     Ca 10.6 (*)    ALK PHOS 91     T Bili 0.45     Total Protein 8.2     Alb 4.7     GLOBULIN 3.5     ALBUMIN/GLOBULIN RATIO 1.3     BUN/CREAT RATIO 15.9     ALT 17     AST 18     eGFR 118     Comment: Normal GFR (glomerular filtration rate) > 60 mL/min/1.73 meters squared, < 60 may include impaired kidney function. Calculation based on the Chronic Kidney Disease Epidemiology Collaboration (CK-EPI)equation refit without adjustment for race.  POCT HCG   POC HCG Negative     Lot Number [id number]     Expiration Date 01/08/2024     INTERNAL QC CHECK PERFORMED Acceptable      Imaging:   CT ABDOMEN PELVIS W IV CONTRAST   Narrative:    CT abdomen and pelvis with contrast:  INDICATION: Abdominal pain  TECHNIQUE: Axial scans were performed through the abdomen and pelvis with intravenous contrast. Contrast-60 mL Isovue-370. Radiation dose reduction was utilized (automated exposure control, mA or kV adjustment based on patient size, or iterative image reconstruction).  COMPARISON: None  FINDINGS:   #  Lung bases: Unremarkable #  Liver: Normal enhancement. No focal lesions. #  Gallbladder: No radiopaque calculi #  Spleen: Normal. #  Pancreas: Normal #  Adrenal glands: Normal. #  Kidneys: Normal. #  Retroperitoneum: No mass or adenopathy. #  Aorta: No significant abnormality. #  Bowel and mesentery: No bowel dilatation. No inflammatory changes seen.   The appendix unremarkable.  The colon is poorly evaluated due to the lack of oral contrast and under distention but demonstrates no abnormality.   Pelvis: #  No mass or adenopathy. #  No inflammatory changes seen. #  Bladder: No significant  abnormality. #  There is an IUD within the uterus.  #  There is no pelvic free fluid.  #  1  #  Bony structures: No significant abnormality.    Impression:    IMPRESSION:    No acute pathology identified.  IUD within the uterus.   Electronically Signed by: Marinda Fleming, MD on 12/10/2022 2:33 AM    ECG: ECG Results   None                               Pre-Sedation Procedures    Medical Decision Making Patient presents with left lower quadrant abdominal pain.  Workup is unremarkable.  Patient feels much better.  Abdominal pain precautions given and advised to return if symptoms worsen.  Amount and/or Complexity of Data Reviewed Labs: ordered. Radiology: ordered.  Risk Prescription drug management.       Provider Communication  New Prescriptions   No medications on file    Modified Medications   No medications on file    Discontinued Medications   No medications on file    Clinical Impression   Final diagnoses:  Left lower quadrant abdominal pain    ED Disposition     ED Disposition  Discharge   Condition  Stable   Comment  --                 Follow-up Information     [facility].   Specialty: Emergency Medicine Comments: As needed Contact information: [address] [facility] [place]  [phone]                 Electronically signed by:    Zada CHRISTELLA Archer, MD 12/10/22 [phone]

## 2022-12-15 DIAGNOSIS — R195 Other fecal abnormalities: Secondary | ICD-10-CM | POA: Diagnosis not present

## 2022-12-15 DIAGNOSIS — M545 Low back pain, unspecified: Secondary | ICD-10-CM | POA: Diagnosis not present

## 2022-12-16 DIAGNOSIS — Z13228 Encounter for screening for other metabolic disorders: Secondary | ICD-10-CM | POA: Diagnosis not present

## 2022-12-16 DIAGNOSIS — R4586 Emotional lability: Secondary | ICD-10-CM | POA: Diagnosis not present

## 2022-12-16 DIAGNOSIS — Z130 Encounter for screening for diseases of the blood and blood-forming organs and certain disorders involving the immune mechanism: Secondary | ICD-10-CM | POA: Diagnosis not present

## 2022-12-16 DIAGNOSIS — Z131 Encounter for screening for diabetes mellitus: Secondary | ICD-10-CM | POA: Diagnosis not present

## 2022-12-16 DIAGNOSIS — F909 Attention-deficit hyperactivity disorder, unspecified type: Secondary | ICD-10-CM | POA: Diagnosis not present

## 2022-12-16 DIAGNOSIS — Z1329 Encounter for screening for other suspected endocrine disorder: Secondary | ICD-10-CM | POA: Diagnosis not present

## 2022-12-16 NOTE — Progress Notes (Signed)
 Assessment and Plan   1. Screening for endocrine, metabolic and immunity disorder (Primary) -     TSH  2. Screening for diabetes mellitus (DM) -     Hemoglobin A1c  3. Attention deficit hyperactivity disorder (ADHD), unspecified ADHD type -     Ambulatory referral to Psychiatry  4. Mood swings -     Ambulatory referral to Psychiatry  Will contact patient regarding lab results and advise if any adjustments need to be made to plan of care.  Documentation for time-based billing:  Total time spent of date of service was 30 minutes.  Patient care activities included preparing to see the patient such as reviewing the patient record, obtaining and/or reviewing separately obtained history, performing a medically appropriate history and physical examination, counseling and educating the patient, family, and/or caregiver, ordering prescription medications, tests, or procedures, referring and communicating with other health care providers when not separately reported during the visit, documenting clinical information in the electronic or other health record, and communicating results to the patient/family/caregiver.  --Previous records and medical history reviewed  -Risks, benefits, and alternatives of the medications and treatment plan prescribed today were discussed, and patient expressed understanding. Plan follow-up as discussed or as needed if any worsening symptoms or change in condition.    Follow up in about 4 weeks (around 01/13/2023) for for Annual Physical Exam.   Subjective   Chief Complaint    Patient presents with  . Follow-up From Ed    HPI  Chelsea Dorsey presents today for follow-up.  She was seen at a local urgent care yesterday for right-sided back pain with black tarry stools.  A referral was placed to gastroenterology at that time.  She was seen at the ED on 3/7 lower left abdominal pain and back pain.  She had mildly elevated leukocytosis, CT abdomen pelvis  showed no acute pathology. Today she states she is still having back pain but the Flexeril  is starting to help.  She denies dark tarry stools.  She states she has a upcoming appointment with neuro spine specialist tomorrow. Her abdominal pain has resolved.  She denies nausea/vomiting, diarrhea or fever.  She has been drinking plenty water.  She states she has not taken any ibuprofen  or Tylenol  over the past few days. Her main concern today is testing for thyroid issues.  She states she has been having mood swings and gets agitated and angry at small things.  She states her mother has a history of mental health disorders.  She denies SI/HI. She states when she was a child and teenager she was diagnosed with ADHD and was on Adderall.  She would like to start taking medication for ADHD again. She does not smoke, vape or drink alcohol. 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  She lives with her boyfriend and her 34-year-old daughter.  PCP Patient's PCP is No primary care provider on file.SABRA    Health Maintenance Health Maintenance Due  Topic Date Due  . Pap Smear  Never done  . COVID-19 Vaccine (1) Never done  . DTaP/Tdap/Td Vaccines (1 - Tdap) Never done    Review of Systems  Gastrointestinal:  Negative for abdominal pain, diarrhea, nausea and vomiting.  Musculoskeletal:  Positive for back pain.  Psychiatric/Behavioral:  Positive for dysphoric mood. The patient is not nervous/anxious.      Smoking Status Social History   Tobacco Use  Smoking Status Never  Smokeless Tobacco Not on file  Allergies  Allergies  Allergen Reactions  . Penicillins Anaphylaxis    Medications  Current Outpatient Medications:  .  cyclobenzaprine  (FLEXERIL ) 5 mg tablet, Take one tablet to two tablets (5-10 mg dose) by mouth 3 (three) times a day as needed for Muscle spasms for up to 10 days., Disp: 60 tablet, Rfl: 0 .  levonorgestrel  (MIRENA ) IUD, one each by  Intrauterine route once., Disp: , Rfl:  .  VENTOLIN HFA 108 (90 Base) MCG/ACT inhaler, SMARTSIG:1 Inhalation By Mouth 3 Times Daily, Disp: , Rfl:   Problem List Patient Active Problem List  Diagnosis  . Attention deficit hyperactivity disorder (ADHD)  . Mood swings     Reviewed and updated this visit by provider: Tobacco  Allergies  Meds  Problems  Med Hx  Surg Hx  Fam Hx        Objective   Vitals:   12/16/22 1453  BP: 124/80  Patient Position: Sitting  Pulse: 93  Temp: 97.5 F (36.4 C)  TempSrc: Temporal  Resp: 16  Height: 5' 3.5 (1.613 m)  Weight: 235 lb (106.6 kg)  SpO2: 98%  BMI (Calculated): 41  PainSc:   3  PainLoc: Back    Physical Exam Vitals and nursing note reviewed.  Constitutional:      General: She is not in acute distress.    Appearance: Normal appearance. She is obese. She is not ill-appearing.  HENT:     Mouth/Throat:     Mouth: Mucous membranes are moist.     Pharynx: Oropharynx is clear.  Cardiovascular:     Rate and Rhythm: Normal rate and regular rhythm.     Pulses: Normal pulses.     Heart sounds: Normal heart sounds. No murmur heard.    No friction rub. No gallop.  Musculoskeletal:     Cervical back: Normal range of motion.     Right lower leg: No edema.     Left lower leg: No edema.  Pulmonary:     Effort: Pulmonary effort is normal. No respiratory distress.     Breath sounds: Normal breath sounds. No wheezing, rhonchi or rales.  Lymphadenopathy:     Cervical: No cervical adenopathy.  Skin:    Findings: No erythema or rash.  Neurological:     Mental Status: She is alert.  Psychiatric:        Mood and Affect: Mood normal.     Comments: She is pleasant, talkative.

## 2022-12-18 NOTE — Progress Notes (Signed)
 Hi Mysha!  It was great meeting you!  Good news, your thyroid-stimulating hormone is normal and your diabetes screening is negative.  Have a good weekend. Chere

## 2022-12-24 DIAGNOSIS — M545 Low back pain, unspecified: Secondary | ICD-10-CM | POA: Diagnosis not present

## 2022-12-24 DIAGNOSIS — M47816 Spondylosis without myelopathy or radiculopathy, lumbar region: Secondary | ICD-10-CM | POA: Diagnosis not present

## 2022-12-24 DIAGNOSIS — M5137 Other intervertebral disc degeneration, lumbosacral region: Secondary | ICD-10-CM | POA: Diagnosis not present

## 2022-12-24 DIAGNOSIS — M5136 Other intervertebral disc degeneration, lumbar region: Secondary | ICD-10-CM | POA: Diagnosis not present

## 2022-12-24 NOTE — Progress Notes (Signed)
 Assessment   32 y.o. female with  Chief Complaint  Patient presents with  . Back Pain    Patient states she has low back pain. She rates her pain a 6/10.   , consistent with 1. Acute bilateral low back pain without sciatica      Differentials including but not limited to sacroiliitis, lumbar spondylosis, lumbar facet arthropathy, lumbar DDD, lumbar foraminal stenosis.   Plan    Urine drug screen was not obtained today to establish a baseline for the patient.  A [place]  controlled substance database lemmie was performed in the PMP AWAR[place]  reviewing patient's 12 month history and was appropriate.  Opioid risk tool score: 1low risk OPIOID RISK TOOL  More data may exist      12/24/2022  Opioid Risk Tool  Family history alcohol  0  Family history illegal drugs 0  Family history rx drugs 0  Personal history alcohol 0  Personal history illegal drugs 0  Personal history rx drugs 0  Age between 16-45 years 1*  History of preadolescent sexual abuse 0  ADD, OCD, bipolar, schizophrenia 0  Depression 0  Scoring Total 1  Interpretation Low risk for opioid abuse     Opioid Medications: None prescribed.  Adjuvant Medications: Stop flexeril  and start Methocarbamol 500mg  TID as needed.   Specialized Treatments/Imaging/Procedures: We discussed the benefits of physical therapy. We will send in a referral to physical therapy for further evaluation and treatment.  Will obtain XR of the lumbar spine. She also would like to see a chiropractor before considering any further additional treatment considerations, provided her with [facility] approved chiropractor list.  Injections: None planned at this time per patient request.  Follow up: Follow up if symptoms worsen or fail to improve.    History of Present Illness  Patient was referred to [facility] by Tinnie KATHEE Daring, FNP  Initial HPI: Chelsea Dorsey presents to [facility] for the  evaluation of   Chief Complaint  Patient presents with  . Back Pain    Patient states she has low back pain. She rates her pain a 6/10.       Chelsea Dorsey is a 32 y.o. female who presents with pain located in the low back, right>left. Denies any radiation to the lower extremities. Denies any numbness, tingling. Patient denies weakness, saddle anesthesia, bowel or bladder incontinence. She reports this pain about 2 weeks ago following no known injuries. She was seen and evaluated at Urgent Care on 12/15/22, prescribed flexeril , however this makes her too drowsy. Has also been taking ibuprofen  as needed and using a heating pad without significant relief. She is interested in consider a chiropractor.   Pain rating: 6 out of 10 Pain quality: Sharp, Sore, Squeezing, Tightness Aggrevating factors: Bending Alleviating factors: - Activities affected by pain: Work Previous evaluations: PCP, UC  Physical Therapy/Home Exercise:  None  Current Pain Medications: - Opioids: None - NSAIDs: ibuprofen  - Anti-Depressants: None - Anti-Convulsants: None - Others: Tylenol  - Anticoagulants: None   Previous Interventions/Procedures: None  Previous Surgeries: None  Imaging  No recent relevant imaging to review.  I personally reviewed and visualized the imaging studies if available.   Review of Systems  A complete 13 point review of systems was performed and reviewed, the pertinent findings are documented in history of present illness, see intake form from today's examination for full details.  Denies new numbness or weakness Denies current chest pain, SOB, Oversedation    Past Medical  History    Reviewed and updated this visit by provider: Tobacco  Allergies  Meds  Problems  Med Hx  Surg Hx  Fam Hx         Physical Exam  BP: 121/83 Resp:      Pulse: 80        SPO2:    General: Well developed, well nourished female. No acute distress.   Psychiatric:  Cooperative, Appropriate mood &  affect, Normal judgment.   HEENT:  Normocepalic, pupils are equal and round, conjunctiva is clear, mucous membranes moist Skin:  Warm, Dry, Pink, No rash.     Respiratory:  Respirations are non-labored, symmetrical chest wall expansion.   Cardiovascular:  Well perfused. No evidence of cyanosis/edema.   Back: Lumbar flexion 40-50, lumbar extension, 0-5, limited by concordant pain in the lumbar paravertebral area. Facet loading maneuvers are positive bilaterally. There is bilateral PSIS tenderness. Negative straight leg raising signs bilaterally. Noted lumbar paravertebral muscle spasm. Positive bilateral Patrick's test, right>left. Positive gaenslen's   Extremities: Lower extremity motor strength appears to be grossly intact. Extensor hallucis longus is intact bilaterally. Neuro: normal without focal findings mental status, speech normal, alert and oriented Gait antalgic, stiff    Stefanie A Dubicki, PA-C December 24, 2022   Patients pain was assessed, documented as positive, unless stated in the HPI, and followup plan has been documented in the plan. Patients medications list was reviewed and updated if applicable. Body Mass Index (BMI) screening was documented and if the patients BMI was greater than 25 or less than 18.5, the patient was asked to follow up with their PCP for a full assessment regarding weight management. If Fluoroscopy was used during a procedure, the radiation exposure time and number of images were documented within the record.  Urine Drug Screening is a widely available and familiar form of testing used in order to make informed choices with regard to clinical decision making in chronic pain management patients.  Opioid analgesics must be prescribed with discernment and their appropriate use must be assessed periodically.  Urine Drug Screening can help track patient compliance, identify substance abuse and misuse, and prevent potentially dangerous drug-drug interactions.  Today,  an 11-panel point of care immunoassay and alcohol screen was performed in order to accomplish this and to help determine the best course of action for today's visit.  Immunoassay urine drug screening, like all tests, is not without its limitations with regard to specificity and sensitivity.  For example, 1) We are unable to detect fentanyl  with our currently available immunoassay technology.  2) We are unable to determine the specific medications present and their metabolites within the drug classes.  3) The Opiate class represents morphine , hydrocodone , and hydromorphone.  Differentiation between these cannot be determined.  4) Likewise, the Methadone class represents methadone and tapentadol.   5) The Oxycodone  class represents oxycodone  and oxymorphone.  The Benzodiazepine class contains all commonly prescribed in that category.  Due to these and other limitations encountered with this form of screening, confirmatory testing may also be deemed to be medically necessary and ordered to assist in making decisions regarding this patient's long-term care.  If an opioid medication was prescribed the patient has also been informed of: -Serious adverse effects of opioids, including potentially fatal respiratory depression and development of a potentially serious lifelong opioid use disorder that can cause distress and inability to fulfill major role obligations. -Common effects of opioids, such as constipation, dry mouth, nausea, vomiting, drowsiness, confusion, tolerance, physical dependence,  and withdrawal symptoms when stopping opioids. To prevent constipation associated with opioid use, patient advised to increase hydration and fiber intake and to maintain or increase physical activity. Stool softeners or laxatives are recommended prn. -Effects that opioids might have on ability to safely operate a vehicle, particularly when opioids are initiated, when dosages are increased, or when other central nervous system  depressants, such as benzodiazepines or alcohol, are used concurrently.  -Increased risks for opioid use disorder, respiratory depression, and death at higher dosages, along with the importance of taking only the amount of opioids prescribed, i.e., not taking more opioids or taking them more often. -Increased risks for respiratory depression when opioids are taken with benzodiazepines, other sedatives, alcohol, illicit drugs or other opioids. -Risks to household members and other individuals if opioids are intentionally or unintentionally shared with others for whom they are not prescribed, including the possibility that others might experience overdose at the same or at lower dosage than prescribed for the patient, and that young children are susceptible to unintentional ingestion. We discussed storage of opioids in a secure, preferably locked location and options for safe disposal of unused opioids. -Planned use of precautions to reduce risks, including use of prescription drug monitoring program information and urine drug testing. We discussed use of naloxone use for overdose reversal. We agreed to review risks and benefits of COT at least every 3 months. -Cognitive limitations might interfere with management of opioid therapy and, if so, determined whether a caregiver can responsibly co-manage medication therapy. Safer alternative medication use was discussed with both the patient and caregiver.                    ..        *Some images could not be shown.

## 2023-12-01 ENCOUNTER — Encounter: Payer: Self-pay | Admitting: Advanced Practice Midwife

## 2023-12-01 ENCOUNTER — Ambulatory Visit: Payer: No Typology Code available for payment source | Admitting: Advanced Practice Midwife

## 2023-12-01 VITALS — BP 133/84 | HR 88 | Ht 62.0 in

## 2023-12-01 DIAGNOSIS — Z30432 Encounter for removal of intrauterine contraceptive device: Secondary | ICD-10-CM

## 2023-12-01 NOTE — Progress Notes (Signed)
   IUD REMOVAL  Patient name: Chelsea Dorsey MRN [id number]  Date of birth: April 27, 1991 Subjective Findings:   Chelsea Dorsey is a 33 y.o. G41P1001 Caucasian female being seen today for removal of a Mirena  IUD. Her IUD was placed Jan 2023.  She desires removal because she wants to try for pregnancy. Signed copy of informed consent in chart.  No LMP recorded. (Menstrual status: IUD). Last pap Jan 2023. Results were: NILM w/ HRHPV negative The planned method of family planning is none     10/14/2021   10:19 AM  Depression screen PHQ 2/9  Decreased Interest 2  Down, Depressed, Hopeless 3  PHQ - 2 Score 5  Altered sleeping 3  Tired, decreased energy 2  Change in appetite 3  Feeling bad or failure about yourself  2  Trouble concentrating 2  Moving slowly or fidgety/restless 0  Suicidal thoughts 0  PHQ-9 Score 17        10/14/2021   10:19 AM  GAD 7 : Generalized Anxiety Score  Nervous, Anxious, on Edge 2  Control/stop worrying 2  Worry too much - different things 2  Trouble relaxing 2  Restless 1  Easily annoyed or irritable 3  Afraid - awful might happen 3  Total GAD 7 Score 15     Pertinent History Reviewed:   Reviewed past medical,surgical, social, obstetrical and family history.  Reviewed problem list, medications and allergies. Objective Findings & Procedure:    Vitals:   12/01/23 1139  BP: 133/84  Pulse: 88  Height: 5\' 2"  (1.575 m)  Body mass index is 40.6 kg/m.  No results found for this or any previous visit (from the past 24 hours).   Time out was performed.  A Pederson speculum was placed in the vagina.  The cervix was visualized, and the strings were visible. They were grasped and the Mirena  IUD was easily removed intact without complications. The patient tolerated the procedure well.   Chaperone: Faith Rogue   Assessment & Plan:   1) Mirena  IUD removal Follow-up prn problems  No orders of the defined types were placed in this  encounter.   Follow-up: Return for prn.  Chelsea Dorsey CNM 12/01/2023 12:04 PM

## 2024-01-24 DIAGNOSIS — R1012 Left upper quadrant pain: Secondary | ICD-10-CM | POA: Diagnosis not present

## 2024-01-24 DIAGNOSIS — N858 Other specified noninflammatory disorders of uterus: Secondary | ICD-10-CM | POA: Diagnosis not present

## 2024-01-24 DIAGNOSIS — Z87891 Personal history of nicotine dependence: Secondary | ICD-10-CM | POA: Diagnosis not present

## 2024-01-24 DIAGNOSIS — R1032 Left lower quadrant pain: Secondary | ICD-10-CM | POA: Diagnosis not present

## 2024-01-24 DIAGNOSIS — K760 Fatty (change of) liver, not elsewhere classified: Secondary | ICD-10-CM | POA: Diagnosis not present

## 2024-01-24 DIAGNOSIS — R0602 Shortness of breath: Secondary | ICD-10-CM | POA: Diagnosis not present

## 2024-01-24 DIAGNOSIS — R10812 Left upper quadrant abdominal tenderness: Secondary | ICD-10-CM | POA: Diagnosis not present

## 2024-01-24 DIAGNOSIS — R197 Diarrhea, unspecified: Secondary | ICD-10-CM | POA: Diagnosis not present

## 2024-01-24 DIAGNOSIS — R112 Nausea with vomiting, unspecified: Secondary | ICD-10-CM | POA: Diagnosis not present

## 2024-01-24 NOTE — ED Provider Notes (Addendum)
 [facility]  ED Provider Note Patient seen and received a tele-medical screening examination in triage.  The provider performing the medical screening exam was not located at the facility and was located remotely.  Patient understands that the provider is seeing them remotely and consents to the exam.  Additionally, the patient has been advised of the risks and benefits of a video visit that differ from in-person treatment such as: a limited physical examination, unforeseen disruptions to connectivity, risks to patient confidentiality and privacy. Patient was also explained the risks of the exam which include video or sound dysfunction, or minor portions of the exam appropriate orders have been initiated based on my brief physical exam and HPI. Patient placed in appropriate area until a treatment room becomes available for further evaluation and management by the in-house provider  +left abd pain with NVD  Chelsea Dorsey 33 y.o. female DOB: 1991-05-27 MRN: [id number] History   Chief Complaint  Patient presents with  . Abdominal Pain    C/o left lower abd pain x 8 hrs with n/v/d , IUD removed last month    Chelsea Dorsey presents to the emergency department with left-sided abdominal pain that started yesterday.  The pain feels like a pressure and is radiating up the left side of her chest and into her back.  She has developed nausea, vomiting, and diarrhea.  She also feels dyspnea.  No cough or fever.  No dysuria or hematuria.   History provided by:  Patient      Past Medical History:  Diagnosis Date  . Anxiety 2020   After leaving my ex husband and taking my dayghter full time    History reviewed. No pertinent surgical history.  Social History   Substance and Sexual Activity  Alcohol Use Not Currently  . Alcohol/week: 4.0 standard drinks of alcohol  . Types: 4 Glasses of wine per week   Comment: Per month   Social  History   Tobacco Use  Smoking Status Never  Smokeless Tobacco Not on file   E-Cigarettes  . Vaping Use Never User   . Start Date    . Cartridges/Day    . Quit Date     Social History   Substance and Sexual Activity  Drug Use Never         Allergies  Allergen Reactions  . Penicillins Anaphylaxis    Home Medications   LEVONORGESTREL  (MIRENA ) IUD    one each by Intrauterine route once.    Primary Survey  Primary Survey  Review of Systems   Review of Systems  Physical Exam   ED Triage Vitals [01/24/24 1240]  BP (!) 176/100  Heart Rate 78  Resp 18  SpO2 100 %  Temp 97.4 F (36.3 C)   Vitals:   01/24/24 1240 01/24/24 1709 01/24/24 1709  BP: (!) 176/100  (!) 149/98  BP Location: Right Upper Arm    Pulse: 78  59  Resp: 18  19  Temp: 97.4 F (36.3 C) 97.9 F (36.6 C) 97.9 F (36.6 C)  TempSrc: Oral    SpO2: 100%  98%  Weight: 97.5 kg (215 lb)    Height: 1.6 m (5' 3)      Physical Exam  Nursing note and vitals reviewed. Constitutional: She appears to be in pain and no respiratory distress.  HENT:  Head: Normocephalic and atraumatic.  Eyes: Conjunctivae are normal.  Cardiovascular: Normal rate, regular rhythm and normal heart sounds.  Pulmonary/Chest: No respiratory  distress. Respiratory effort normal and breath sounds normal.  Abdominal: Soft. There is moderate abdominal tenderness in the left upper quadrant and left lower quadrant. Abdomen not distended. There is CVA tenderness (left).  Musculoskeletal: No obvious deformity noted to extremities. no edema.  Neurological: She is alert and oriented to person, place, and time. Moves all extremities equally.  Skin: Skin is warm. Skin is dry.  Psychiatric: She has a normal mood and affect. Her behavior is normal.     ED Course   Lab results:   URINALYSIS W/MICRO REFLEX CULTURE - SYMPTOMATIC - Abnormal      Result Value   Urine Color Yellow     Urine Clarity Clear     Urine Specific Gravity  1.029     Urine pH 5.5     Urine Protein - Dipstick 20 (*)    Urine Glucose Negative     Urine Ketones 20 (*)    Urine Bilirubin Negative     Urine Blood 0.2 (*)    Urine Nitrite Negative     Urine Urobilinogen <2     Urine Leukocyte Esterase Negative     Urine Squamous Epithelial Cells 0-2     Urine WBC 0-2     Urine RBC 0-2     Urine Bacteria None     UA Microscopic Yes Micro (*)    Narrative:    Does not meet criteria for reflex to Urine Culture.  CBC AND DIFFERENTIAL - Abnormal   WBC 14.3 (*)    RBC 5.05     HGB 16.1 (*)    HCT 46.4 (*)    MCV 91.9     MCH 31.9     MCHC 34.7     Plt Ct 208     RDW SD 41.0     MPV 12.1     NRBC% 0.0     Absolute NRBC Count 0.00     NEUTROPHIL % 79.5     LYMPHOCYTE % 16.0     MONOCYTE % 3.5     Eosinophil % 0.1     BASOPHIL % 0.3     IG% 0.6     ABSOLUTE NEUTROPHIL COUNT 11.39 (*)    ABSOLUTE LYMPHOCYTE COUNT 2.29     Absolute Monocyte Count 0.50     Absolute Eosinophil Count 0.01     Absolute Basophil Count 0.05     Absolute Immature Granulocyte Count 0.08 (*)   COMPREHENSIVE METABOLIC PANEL - Abnormal   Na 137     Potassium 3.7     Cl 104     CO2 21     AGAP 12     Glucose 106 (*)    BUN 13     Creatinine 0.73     Ca 9.5     ALK PHOS 82     T Bili 0.5     Total Protein 7.8     Alb 4.5     GLOBULIN 3.3     ALBUMIN/GLOBULIN RATIO 1.4     BUN/CREAT RATIO 17.8     ALT 16     AST 15     eGFR 112     Comment: Normal GFR (glomerular filtration rate) > 60 mL/min/1.73 meters squared, < 60 may include impaired kidney function. Calculation based on the Chronic Kidney Disease Epidemiology Collaboration (CK-EPI)equation refit without adjustment for race.  HCG, URINE, QUALITATIVE - Normal   U BETA HCG QUAL Negative    LIPASE - Normal  Lipase 23    LIGHT BLUE TOP  LAVENDER TOP  MINT GREEN-TOP TUBE (PST GEL/LI HEP)  GOLD SST    Imaging:   XR CHEST AP PORTABLE   Narrative:    History:  Shortness of  breath  Comparison:  None  Technique: Portable chest  Interpretation: The lungs are clear.  The heart size is within normal limits.    Impression:    Impression: No acute findings.  Electronically Signed by: Lynwood Portugal on 01/24/2024 3:44 PM  CT ABDOMEN PELVIS W IV CONTRAST   Narrative:    INDICATION: Abdominal Pain  COMPARISON:  12/10/2022.   TECHNIQUE:  CT ABDOMEN PELVIS W IV CONTRAST - 80 mL  Isovue 370 were administered intravenously. Dose reduction was utilized (automated exposure control, mA or kV adjustment based on patient size, or iterative image   FINDINGS:   SOLID VISCERA: - Liver: Mildly decreased attenuation diffusely. - Gallbladder: Normal - Pancreas: Normal. - Adrenal glands: Normal. - Spleen: Normal. - Kidneys: Normal.  GI: - No bowel obstruction. Normal appendix. - No inflammatory changes.   PERITONEAL CAVITY/RETROPERITONEUM: - No free fluid. - No pneumoperitoneum. - No lymphadenopathy. - Aorta, IVC, iliac arteries, and major visceral arteries are grossly normal.  PELVIS: - Unremarkable bladder. Mild diffuse endometrial thickening. No adnexal mass.  VISUALIZED LOWER THORAX: - No acute abnormalities.  MUSCULOSKELETAL: - No acute or destructive osseous processes.  MISC: - N/A or as above.    Impression:    IMPRESSION:  1. No visualized acute abnormality. 2. Mild diffuse endometrial thickening, which can be normal depending on the patient's time in the menstrual cycle. 3. Mild hepatic steatosis.  Electronically Signed by: Reyes Luna, MD on 01/24/2024 4:24 PM      ECG: ECG Results   None                                                               Pre-Sedation Procedures    Medical Decision Making Chelsea Dorsey presents to the ED with severe abdominal pain, nausea, and vomiting.  Pain was radiating into her chest.  She was fully evaluated for evidence of intra-abdominal pathology.   She was tender to palpation, and I did think this was an intra-abdominal source rather than a cardiopulmonary source.  However, I did order a chest x-ray to make sure she had no obvious pneumonia, pneumothorax, or other abnormality.  Given her age and risk factors, I elected not to do a cardiac exam with serial troponins.  She was treated symptomatically.  No clear etiology for her symptoms, but she felt better.  She was given a prescription for Toradol and some Zofran  and will follow-up if symptoms worsen.  Problems Addressed: Left upper quadrant abdominal pain: acute illness or injury with systemic symptoms Nausea and vomiting, unspecified vomiting type: acute illness or injury with systemic symptoms  Amount and/or Complexity of Data Reviewed External Data Reviewed: notes.    Details: Outpatient notes Labs: ordered. Decision-making details documented in ED Course. Radiology: ordered and independent interpretation performed. Decision-making details documented in ED Course.    Details: No bowel obstruction on CT  Risk Prescription drug management. Parenteral controlled substances.           Provider Communication  New Prescriptions   KETOROLAC (TORADOL) 10 MG  TABLET    Take one tablet (10 mg dose) by mouth every 6 (six) hours as needed for Pain for up to 5 days. Take with food      Quantity: 20 tablet    Refills: 0   ONDANSETRON  (ZOFRAN -ODT) 4 MG DISINTEGRATING TABLET    Take one tablet (4 mg dose) by mouth every 8 (eight) hours as needed for Nausea for up to 7 days.      Quantity: 12 tablet    Refills: 0    Modified Medications   No medications on file    Discontinued Medications   No medications on file    Clinical Impression Final diagnoses:  Left upper quadrant abdominal pain  Nausea and vomiting, unspecified vomiting type    ED Disposition     ED Disposition  Discharge   Condition  Stable   Comment  --                   Electronically signed  by:    Therisa KANDICE Silvan, MD 01/24/24 1710    Therisa KANDICE Silvan, MD 01/24/24 [phone]

## 2024-01-24 NOTE — ED Notes (Signed)
 Pt wants to be sure she gets a doctors note prior to leaving.

## 2024-05-17 ENCOUNTER — Ambulatory Visit: Admitting: Obstetrics and Gynecology

## 2024-05-17 ENCOUNTER — Encounter: Payer: Self-pay | Admitting: Obstetrics and Gynecology

## 2024-05-17 VITALS — BP 116/80 | HR 81 | Ht 63.0 in | Wt 217.0 lb

## 2024-05-17 DIAGNOSIS — N926 Irregular menstruation, unspecified: Secondary | ICD-10-CM

## 2024-05-17 LAB — POCT URINE PREGNANCY: Preg Test, Ur: NEGATIVE

## 2024-05-17 MED ORDER — NORETHIN ACE-ETH ESTRAD-FE 1.5-30 MG-MCG PO TABS: 1.0000 | ORAL_TABLET | Freq: Every day | ORAL | 3 refills | Status: AC

## 2024-05-17 NOTE — Progress Notes (Signed)
 GYNECOLOGY OFFICE VISIT NOTE  History:  Chelsea Dorsey is a 33 y.o. G1P1001 here today for bleeding for one month.   She was seen in [place] ED and had CT which showed possible thickened endometrial lining but would depend on where she was in her cycle. Otherwise normal reproductive report. She went in for RUQ pain that radiated into her chest.   She had her IUD removed in February 2025. Prior to that she had a Nexplanon. She had the IUD removed as she would like to TTC.  However, she has reconsidered and may want IUD back in.   She has heavy bleeding every day. Sometimes lightheaded and fatigued from it. Passing clots. Soaking through tampons and pads regularly. She had regular menses until July when this started and has not stopped.   Past Medical History:  Diagnosis Date   Medical history non-contributory    UTI (lower urinary tract infection)     Past Surgical History:  Procedure Laterality Date   NO PAST SURGERIES      The following portions of the patient's history were reviewed and updated as appropriate: allergies, current medications, past family history, past medical history, past social history, past surgical history and problem list.   Health Maintenance:   Normal pap and negative HRHPV: Diagnosis  Date Value Ref Range Status  10/14/2021   Final   - Negative for intraepithelial lesion or malignancy (NILM)   Review of Systems:  Pertinent items noted in HPI and remainder of comprehensive ROS otherwise negative.  Physical Exam:  BP 116/80   Pulse 81   Ht 5' 3 (1.6 m)   Wt 217 lb (98.4 kg)   LMP 04/04/2024 (Approximate)   BMI 38.44 kg/m  CONSTITUTIONAL: Well-developed, well-nourished female in no acute distress.  HEENT:  Normocephalic, atraumatic. External right and left ear normal. No scleral icterus.  NECK: Normal range of motion, supple, no masses noted on observation SKIN: No rash noted. Not diaphoretic. No erythema. No pallor. MUSCULOSKELETAL: Normal  range of motion. No edema noted. NEUROLOGIC: Alert and oriented to person, place, and time. Normal muscle tone coordination. No cranial nerve deficit noted. PSYCHIATRIC: Normal mood and affect. Normal behavior. Normal judgment and thought content.   PELVIC: Deferred  Labs and Imaging Results for orders placed or performed in visit on 05/17/24 (from the past week)  POCT urine pregnancy   Collection Time: 05/17/24  2:16 PM  Result Value Ref Range   Preg Test, Ur Negative Negative   No results found.  Assessment and Plan:  1. Irregular bleeding (Primary) Check routine labs including quant. Check progesterone.  Will do OCP to improve bleeding. If US  is normal, she thinks she will want IUD replaced.  Check TVUS -     POCT urine pregnancy -     TSH Rfx on Abnormal to Free T4 -     CBC -     Progesterone -     norethindrone-ethinyl estradiol-iron (JUNEL FE 1.5/30) 1.5-30 MG-MCG tablet; Take 1 tablet by mouth daily. -     US  PELVIC COMPLETE WITH TRANSVAGINAL; Future     Meds ordered this encounter  Medications   norethindrone-ethinyl estradiol-iron (JUNEL FE 1.5/30) 1.5-30 MG-MCG tablet    Sig: Take 1 tablet by mouth daily.    Dispense:  84 tablet    Refill:  3     Routine preventative health maintenance measures emphasized. Please refer to After Visit Summary for other counseling recommendations.   Return in about 1 month (  around 06/17/2024) for Follow up - possible IUD insertion.  Vina Solian, MD, FACOG Obstetrician & Gynecologist, [facility] for [facility], [facility]

## 2024-05-18 ENCOUNTER — Ambulatory Visit: Payer: Self-pay | Admitting: Obstetrics and Gynecology

## 2024-05-18 LAB — CBC
Hematocrit: 40.7 % (ref 34.0–46.6)
Hemoglobin: 13.3 g/dL (ref 11.1–15.9)
MCH: 31.6 pg (ref 26.6–33.0)
MCHC: 32.7 g/dL (ref 31.5–35.7)
MCV: 97 fL (ref 79–97)
Platelets: 279 x10E3/uL (ref 150–450)
RBC: 4.21 x10E6/uL (ref 3.77–5.28)
RDW: 11.8 % (ref 11.7–15.4)
WBC: 12.4 x10E3/uL — ABNORMAL HIGH (ref 3.4–10.8)

## 2024-05-18 LAB — PROGESTERONE: Progesterone: 0.2 ng/mL

## 2024-05-18 LAB — TSH RFX ON ABNORMAL TO FREE T4: TSH: 2.25 u[IU]/mL (ref 0.450–4.500)

## 2024-05-25 ENCOUNTER — Ambulatory Visit (INDEPENDENT_AMBULATORY_CARE_PROVIDER_SITE_OTHER)

## 2024-05-25 DIAGNOSIS — R9389 Abnormal findings on diagnostic imaging of other specified body structures: Secondary | ICD-10-CM | POA: Diagnosis not present

## 2024-05-25 DIAGNOSIS — N926 Irregular menstruation, unspecified: Secondary | ICD-10-CM | POA: Diagnosis not present

## 2024-06-14 NOTE — Progress Notes (Signed)
 GYNECOLOGY OFFICE VISIT NOTE  History:  Chelsea Dorsey is a 33 y.o. G1P1001 here today for follow up after US . She is for possible IUD insertion.   She had an US  for AUB. Periods irregular. US  showed EL was 20.4mm. I reviewed the images independently and agree with the assessment.      Past Medical History:  Diagnosis Date   Medical history non-contributory    UTI (lower urinary tract infection)     Past Surgical History:  Procedure Laterality Date   NO PAST SURGERIES      The following portions of the patient's history were reviewed and updated as appropriate: allergies, current medications, past family history, past medical history, past social history, past surgical history and problem list.   Health Maintenance:   Diagnosis  Date Value Ref Range Status  10/14/2021   Final   - Negative for intraepithelial lesion or malignancy (NILM)   Review of Systems:  Pertinent items noted in HPI and remainder of comprehensive ROS otherwise negative.  Physical Exam:  BP 116/81   Pulse (!) 57   Ht 5' 2 (1.575 m)   Wt 219 lb 1.9 oz (99.4 kg)   BMI 40.08 kg/m  CONSTITUTIONAL: Well-developed, well-nourished female in no acute distress.  HEENT:  Normocephalic, atraumatic. External right and left ear normal. No scleral icterus.  NECK: Normal range of motion, supple, no masses noted on observation SKIN: No rash noted. Not diaphoretic. No erythema. No pallor. MUSCULOSKELETAL: Normal range of motion. No edema noted. NEUROLOGIC: Alert and oriented to person, place, and time. Normal muscle tone coordination. No cranial nerve deficit noted. PSYCHIATRIC: Normal mood and affect. Normal behavior. Normal judgment and thought content.  PELVIC: Normal appearing external genitalia; normal urethral meatus; normal appearing vaginal mucosa and cervix.  No abnormal discharge noted.  Normal uterine size, no other palpable masses, no uterine or adnexal tenderness. Performed in the presence of a  chaperone  Labs and Imaging Results for orders placed or performed in visit on 06/19/24 (from the past week)  POCT urine pregnancy   Collection Time: 06/19/24  8:58 AM  Result Value Ref Range   Preg Test, Ur Negative Negative   US  PELVIC COMPLETE WITH TRANSVAGINAL Result Date: 06/07/2024 CLINICAL DATA:  Irregular bleeding EXAM: TRANSABDOMINAL AND TRANSVAGINAL ULTRASOUND OF PELVIS TECHNIQUE: Both transabdominal and transvaginal ultrasound examinations of the pelvis were performed. Transabdominal technique was performed for global imaging of the pelvis including uterus, ovaries, adnexal regions, and pelvic cul-de-sac. It was necessary to proceed with endovaginal exam following the transabdominal exam to visualize the uterus and adnexae in better detail. COMPARISON:  None Available. FINDINGS: Uterus Measurements: 10.7 x 4.6 x 6.4 cm = volume: 166 mL. No fibroids or other mass visualized. Anteverted position. Endometrium Thickness: 20.7. Mild thickening, heterogeneity, and scattered hypoechoic areas of cystic change. Right ovary Measurements: 3.6 x 2.2 x 2.9 cm = volume: 11.8 mL. Normal appearance/no adnexal mass. Only seen transabdominally. Left ovary Nonvisualized Other findings No abnormal free fluid. IMPRESSION: 1. Mildly thickened and heterogeneous endometrium with scattered cystic change. If bleeding remains unresponsive to hormonal or medical therapy, focal lesion work-up with sonohysterogram should be considered. Endometrial biopsy should also be considered in pre-menopausal patients at high risk for endometrial carcinoma. (Ref: Radiological Reasoning: Algorithmic Workup of Abnormal Vaginal Bleeding with Endovaginal Sonography and Sonohysterography. AJR 2008; 808:D31-26) 2. Nonvisualized left ovary. Electronically Signed   By: CHRISTELLA.  Shick M.D.   On: 06/07/2024 18:48     GYNECOLOGY OFFICE PROCEDURE NOTE  IUD and EMB Procedure Note Procedure: IUD insertion with Mirena  UPT: Negative GC/CT testing:  Offered and accepts  Patient identified.  Risks, benefits and alternatives of procedure were discussed including irregular bleeding, cramping, infection, malpositioning or misplacement of the IUD outside the uterus which may require further procedure such as laparoscopy. Also discussed >99% contraception efficacy, increased risk of ectopic pregnancy with failure of method.   Emphasized that this did not protect against STIs, condoms recommended during all sexual encounters. Consent signed. Time out performed.   Speculum inserted and cervix visualized, prepped with 3 swabs of betadine. Intracervical block was done with 1% lidocaine  at 12/3/6/9 for a total of 8 cc.   Grasped with a single tooth tenaculum.  Uterus sounded to 9 cm. Endometrial pipelle passed without issue. Moderate tissue retrieved after two passes. IUD then inserted without difficulty per manufacturer's instructions and strings cut to 3 cm below cervical os and all instruments removed. Pt tolerated well with minimal pain and bleeding.    Assessment and Plan:  1. Irregular bleeding (Primary) IUD placed per pt request. Discussed overlap with OCP for one month to help with BTB.   2. Thickened endometrium EMB done today -     Surgical pathology  3. Encounter for IUD insertion Discussed concerning signs/symptoms and to call if heavy bleeding, severe abdominal pain, or fever in the following 3 weeks. Manufacturer pamphlet/patient information given. Reviewed timing of efficacy for contraception and to use an alternative form of birth control until that time. -     POCT urine pregnancy -     ketorolac  (TORADOL ) injection 60 mg   Meds ordered this encounter  Medications   ketorolac  (TORADOL ) injection 60 mg     Routine preventative health maintenance measures emphasized. Please refer to After Visit Summary for other counseling recommendations.   No follow-ups on file.  Vina Solian, MD, FACOG Obstetrician & Gynecologist,  [facility] for [facility], [facility]

## 2024-06-19 ENCOUNTER — Ambulatory Visit (INDEPENDENT_AMBULATORY_CARE_PROVIDER_SITE_OTHER): Admitting: Obstetrics and Gynecology

## 2024-06-19 ENCOUNTER — Other Ambulatory Visit (HOSPITAL_COMMUNITY)
Admission: RE | Admit: 2024-06-19 | Discharge: 2024-06-19 | Disposition: A | Discharge status: Home / Self Care | Source: Ambulatory Visit | Attending: Obstetrics and Gynecology | Admitting: Obstetrics and Gynecology

## 2024-06-19 ENCOUNTER — Encounter: Payer: Self-pay | Admitting: Obstetrics and Gynecology

## 2024-06-19 VITALS — BP 116/81 | HR 57 | Resp 18 | Ht 62.0 in | Wt 219.1 lb

## 2024-06-19 DIAGNOSIS — N926 Irregular menstruation, unspecified: Secondary | ICD-10-CM | POA: Insufficient documentation

## 2024-06-19 DIAGNOSIS — N858 Other specified noninflammatory disorders of uterus: Secondary | ICD-10-CM

## 2024-06-19 DIAGNOSIS — R9389 Abnormal findings on diagnostic imaging of other specified body structures: Secondary | ICD-10-CM | POA: Diagnosis not present

## 2024-06-19 DIAGNOSIS — Z3202 Encounter for pregnancy test, result negative: Secondary | ICD-10-CM | POA: Diagnosis not present

## 2024-06-19 DIAGNOSIS — Z30430 Encounter for insertion of intrauterine contraceptive device: Secondary | ICD-10-CM | POA: Diagnosis not present

## 2024-06-19 LAB — POCT URINE PREGNANCY: Preg Test, Ur: NEGATIVE

## 2024-06-19 MED ORDER — LEVONORGESTREL 20 MCG/DAY IU IUD
1.0000 | INTRAUTERINE_SYSTEM | Freq: Once | INTRAUTERINE | Status: AC
  Administered 2024-06-19: 1 via INTRAUTERINE

## 2024-06-19 MED ORDER — KETOROLAC TROMETHAMINE 60 MG/2ML IM SOLN
60.0000 mg | Freq: Once | INTRAMUSCULAR | Status: AC
  Administered 2024-06-19: 60 mg via INTRAMUSCULAR

## 2024-06-19 NOTE — Addendum Note (Signed)
 Addended by: ORLINDA SILVANO ORN on: 06/19/2024 10:30 AM   Modules accepted: Orders

## 2024-06-20 ENCOUNTER — Ambulatory Visit: Payer: Self-pay | Admitting: Obstetrics and Gynecology

## 2024-06-20 LAB — CERVICOVAGINAL ANCILLARY ONLY
Chlamydia: NEGATIVE
Comment: NEGATIVE
Comment: NORMAL
Neisseria Gonorrhea: NEGATIVE

## 2024-06-20 LAB — SURGICAL PATHOLOGY
